# Patient Record
Sex: Male | Born: 1980 | Race: White | Hispanic: No | Marital: Married | Smoking: Current some day smoker
Health system: Southern US, Community
[De-identification: ages and names within clinical notes are randomized; demographics above are authoritative.]

## PROBLEM LIST (undated history)

## (undated) DIAGNOSIS — F419 Anxiety disorder, unspecified: Secondary | ICD-10-CM

## (undated) DIAGNOSIS — F32A Depression, unspecified: Secondary | ICD-10-CM

## (undated) DIAGNOSIS — F329 Major depressive disorder, single episode, unspecified: Secondary | ICD-10-CM

## (undated) HISTORY — DX: Anxiety disorder, unspecified: F41.9

## (undated) HISTORY — DX: Depression, unspecified: F32.A

## (undated) HISTORY — DX: Major depressive disorder, single episode, unspecified: F32.9

---

## 2008-01-07 ENCOUNTER — Emergency Department (HOSPITAL_COMMUNITY): Admission: EM | Admit: 2008-01-07 | Discharge: 2008-01-07 | Payer: Self-pay | Admitting: Emergency Medicine

## 2008-01-12 ENCOUNTER — Emergency Department (HOSPITAL_COMMUNITY): Admission: EM | Admit: 2008-01-12 | Discharge: 2008-01-12 | Payer: Self-pay | Admitting: Emergency Medicine

## 2009-09-07 ENCOUNTER — Emergency Department (HOSPITAL_COMMUNITY): Admission: EM | Admit: 2009-09-07 | Discharge: 2009-09-07 | Payer: Self-pay | Admitting: Emergency Medicine

## 2009-12-09 ENCOUNTER — Ambulatory Visit: Payer: Self-pay | Admitting: Unknown Physician Specialty

## 2011-07-04 LAB — DIFFERENTIAL
Basophils Relative: 2 — ABNORMAL HIGH
Eosinophils Absolute: 0.1
Eosinophils Relative: 2
Lymphs Abs: 1.3
Monocytes Absolute: 0.6
Monocytes Relative: 12

## 2011-07-04 LAB — RAPID URINE DRUG SCREEN, HOSP PERFORMED
Amphetamines: NOT DETECTED
Cocaine: NOT DETECTED
Opiates: NOT DETECTED
Tetrahydrocannabinol: NOT DETECTED

## 2011-07-04 LAB — URINALYSIS, ROUTINE W REFLEX MICROSCOPIC
Bilirubin Urine: NEGATIVE
Hgb urine dipstick: NEGATIVE
Ketones, ur: NEGATIVE
Nitrite: NEGATIVE
Specific Gravity, Urine: 1.008
Urobilinogen, UA: 0.2

## 2011-07-04 LAB — COMPREHENSIVE METABOLIC PANEL
ALT: 14
AST: 21
Alkaline Phosphatase: 55
Calcium: 8.7
GFR calc Af Amer: >60
Potassium: 3.4 — ABNORMAL LOW
Sodium: 137
Total Protein: 6.5

## 2011-07-04 LAB — CBC
MCHC: 35.2
RBC: 4.9
RDW: 12.5

## 2011-07-04 LAB — ETHANOL: Alcohol, Ethyl (B): 26 — ABNORMAL HIGH

## 2011-12-12 ENCOUNTER — Emergency Department: Payer: Self-pay | Admitting: Emergency Medicine

## 2011-12-12 LAB — URINALYSIS, COMPLETE
Bilirubin,UR: NEGATIVE
Glucose,UR: NEGATIVE mg/dL (ref 0–75)
Ketone: NEGATIVE
Nitrite: POSITIVE
Ph: 5 (ref 4.5–8.0)
Protein: 100
RBC,UR: 27 /HPF (ref 0–5)
Specific Gravity: 1.031 (ref 1.003–1.030)
Squamous Epithelial: NONE SEEN
Transitional Epi: 13
WBC UR: 940 /HPF (ref 0–5)

## 2011-12-12 LAB — CBC
HCT: 43.6 % (ref 40.0–52.0)
HGB: 15 g/dL (ref 13.0–18.0)
MCH: 32.1 pg (ref 26.0–34.0)
MCHC: 34.4 g/dL (ref 32.0–36.0)
MCV: 93 fL (ref 80–100)
Platelet: 207 10*3/uL (ref 150–440)
RBC: 4.67 10*6/uL (ref 4.40–5.90)
RDW: 12 % (ref 11.5–14.5)
WBC: 19 10*3/uL — ABNORMAL HIGH (ref 3.8–10.6)

## 2011-12-12 LAB — BASIC METABOLIC PANEL
Anion Gap: 13 (ref 7–16)
BUN: 16 mg/dL (ref 7–18)
Calcium, Total: 9.3 mg/dL (ref 8.5–10.1)
Chloride: 105 mmol/L (ref 98–107)
Co2: 25 mmol/L (ref 21–32)
Creatinine: 1.27 mg/dL (ref 0.60–1.30)
EGFR (African American): >60
EGFR (Non-African Amer.): >60
Glucose: 139 mg/dL — ABNORMAL HIGH (ref 65–99)
Osmolality: 288 (ref 275–301)
Potassium: 3.8 mmol/L (ref 3.5–5.1)
Sodium: 143 mmol/L (ref 136–145)

## 2011-12-14 ENCOUNTER — Inpatient Hospital Stay: Payer: Self-pay | Admitting: *Deleted

## 2011-12-14 LAB — BASIC METABOLIC PANEL
Anion Gap: 13 (ref 7–16)
BUN: 14 mg/dL (ref 7–18)
Calcium, Total: 9 mg/dL (ref 8.5–10.1)
Creatinine: 1.13 mg/dL (ref 0.60–1.30)
EGFR (Non-African Amer.): >60
Glucose: 100 mg/dL — ABNORMAL HIGH (ref 65–99)
Osmolality: 280 (ref 275–301)
Potassium: 3.8 mmol/L (ref 3.5–5.1)
Sodium: 140 mmol/L (ref 136–145)

## 2011-12-14 LAB — URINALYSIS, COMPLETE
Bacteria: NONE SEEN
Bilirubin,UR: NEGATIVE
Glucose,UR: NEGATIVE mg/dL (ref 0–75)
Nitrite: POSITIVE
RBC,UR: 44 /HPF (ref 0–5)
Specific Gravity: 1.021 (ref 1.003–1.030)
WBC UR: 16 /HPF (ref 0–5)

## 2011-12-14 LAB — CBC WITH DIFFERENTIAL/PLATELET
Basophil #: 0 10*3/uL (ref 0.0–0.1)
Basophil %: 0.1 %
Eosinophil #: 0 10*3/uL (ref 0.0–0.7)
Eosinophil %: 0 %
HCT: 39.3 % — ABNORMAL LOW (ref 40.0–52.0)
Lymphocyte %: 5.2 %
MCH: 31.8 pg (ref 26.0–34.0)
MCHC: 34 g/dL (ref 32.0–36.0)
Monocyte #: 1.5 10*3/uL — ABNORMAL HIGH (ref 0.0–0.7)
Neutrophil #: 13.1 10*3/uL — ABNORMAL HIGH (ref 1.4–6.5)
RBC: 4.21 10*6/uL — ABNORMAL LOW (ref 4.40–5.90)
RDW: 11.7 % (ref 11.5–14.5)
WBC: 15.4 10*3/uL — ABNORMAL HIGH (ref 3.8–10.6)

## 2011-12-15 LAB — CBC WITH DIFFERENTIAL/PLATELET
Basophil #: 0 10*3/uL (ref 0.0–0.1)
Eosinophil #: 0 10*3/uL (ref 0.0–0.7)
HCT: 36.4 % — ABNORMAL LOW (ref 40.0–52.0)
Lymphocyte %: 15.9 %
MCHC: 33.6 g/dL (ref 32.0–36.0)
Monocyte %: 11.5 %
Neutrophil #: 5.5 10*3/uL (ref 1.4–6.5)
Neutrophil %: 72.1 %
Platelet: 193 10*3/uL (ref 150–440)
RDW: 12.1 % (ref 11.5–14.5)
WBC: 7.6 10*3/uL (ref 3.8–10.6)

## 2011-12-15 LAB — BASIC METABOLIC PANEL
Anion Gap: 10 (ref 7–16)
BUN: 8 mg/dL (ref 7–18)
Chloride: 105 mmol/L (ref 98–107)
EGFR (Non-African Amer.): >60
Glucose: 93 mg/dL (ref 65–99)
Osmolality: 279 (ref 275–301)
Potassium: 3.9 mmol/L (ref 3.5–5.1)
Sodium: 141 mmol/L (ref 136–145)

## 2011-12-15 LAB — URINE CULTURE

## 2011-12-27 LAB — CULTURE, BLOOD (SINGLE)

## 2011-12-29 ENCOUNTER — Emergency Department (HOSPITAL_COMMUNITY)
Admission: EM | Admit: 2011-12-29 | Discharge: 2011-12-30 | Disposition: A | Discharge status: Home / Self Care | Payer: Worker's Compensation | Attending: Emergency Medicine | Admitting: Emergency Medicine

## 2011-12-29 ENCOUNTER — Encounter (HOSPITAL_COMMUNITY): Payer: Self-pay | Admitting: Emergency Medicine

## 2011-12-29 DIAGNOSIS — F17200 Nicotine dependence, unspecified, uncomplicated: Secondary | ICD-10-CM | POA: Insufficient documentation

## 2011-12-29 DIAGNOSIS — K644 Residual hemorrhoidal skin tags: Secondary | ICD-10-CM | POA: Insufficient documentation

## 2011-12-29 DIAGNOSIS — K649 Unspecified hemorrhoids: Secondary | ICD-10-CM

## 2011-12-29 NOTE — ED Notes (Signed)
PT. REPORTS RECTAL PAIN "MASS" ONSET LAST Tuesday , DENIES INJURY/ NO DISCHARGE.

## 2011-12-30 MED ORDER — HYDROCORTISONE ACE-PRAMOXINE 1-1 % RE FOAM: 1.0000 | Freq: Two times a day (BID) | RECTAL | Status: AC

## 2011-12-30 MED ORDER — HYDROCODONE-ACETAMINOPHEN 5-325 MG PO TABS: 1.0000 | ORAL_TABLET | Freq: Once | ORAL | Status: AC

## 2011-12-30 MED ORDER — HYDROCODONE-ACETAMINOPHEN 5-325 MG PO TABS
1.0000 | ORAL_TABLET | Freq: Once | ORAL | Status: AC
  Administered 2011-12-30: 1 via ORAL
  Filled 2011-12-30: qty 1

## 2011-12-30 MED ORDER — DOCUSATE SODIUM 100 MG PO CAPS: 100.0000 mg | ORAL_CAPSULE | Freq: Two times a day (BID) | ORAL | Status: AC

## 2011-12-30 NOTE — ED Provider Notes (Signed)
History     CSN: [id number]  Arrival date & time 12/29/11  2015   First MD Initiated Contact with Patient 12/30/11 0005      Chief Complaint  Patient presents with  . Rectal Pain    HPI The patient noticed rectal pain a couple of days ago. He first noticed after he had been lifting something at work. The pain has been getting worse. He has felt a swelling in the area as well. He denies any fevers, vomiting, or diarrhea. He denies any previous symptoms similar to this in the past. The pain is moderate to severe and increases with movement and palpation. He has not had any rectal bleeding. He has not had trouble with constipation  .History reviewed. No pertinent past medical history.  History reviewed. No pertinent past surgical history.  No family history on file.  History  Substance Use Topics  . Smoking status: Current Everyday Smoker  . Smokeless tobacco: Not on file  . Alcohol Use: Yes      Review of Systems  All other systems reviewed and are negative.    Allergies  Review of patient's allergies indicates no known allergies.  Home Medications  No current outpatient prescriptions on file.  BP 131/79  Pulse 99  Temp(Src) 98.8 F (37.1 C) (Oral)  Resp 16  SpO2 97%  Physical Exam  Nursing note and vitals reviewed. Constitutional: He appears well-developed and well-nourished. No distress.  HENT:  Head: Normocephalic and atraumatic.  Right Ear: External ear normal.  Left Ear: External ear normal.  Eyes: Conjunctivae are normal. Right eye exhibits no discharge. Left eye exhibits no discharge. No scleral icterus.  Neck: Neck supple. No tracheal deviation present.  Cardiovascular: Normal rate.   Pulmonary/Chest: Effort normal. No stridor. No respiratory distress.  Genitourinary: Rectal exam shows external hemorrhoid and tenderness.       Approximately 1 cm sized external hemorrhoid, slight bluish discoloration, hemorrhoid is soft  Musculoskeletal: He exhibits  no edema.  Neurological: He is alert. Cranial nerve deficit: no gross deficits.  Skin: Skin is warm and dry. No rash noted.  Psychiatric: He has a normal mood and affect.    ED Course  Procedures (including critical care time)  Labs Reviewed - No data to display No results found.    MDM  Patient's symptoms are consistent with an external hemorrhoid. There may be a component of thrombosis but it is not completely thrombosed. Patient will prescribe medications for pain and topical steroids. We'll give him a referral to a general surgeon for further treatment if the symptoms do not improve        Celene Kras, MD 12/30/11 [phone]

## 2011-12-30 NOTE — ED Notes (Signed)
The pt says he has a mass on or near his rectum he noticed  This past Tuesday.  Extreme pain tonight while he was at work.  Alert skin arm and dry

## 2011-12-30 NOTE — Discharge Instructions (Signed)
Hemorrhoids Hemorrhoids are enlarged (dilated) veins around the rectum. There are 2 types of hemorrhoids, and the type of hemorrhoid is determined by its location. Internal hemorrhoids occur in the veins just inside the rectum.They are usually not painful, but they may bleed.However, they may poke through to the outside and become irritated and painful. External hemorrhoids involve the veins outside the anus and can be felt as a painful swelling or hard lump near the anus.They are often itchy and may crack and bleed. Sometimes clots will form in the veins. This makes them swollen and painful. These are called thrombosed hemorrhoids. CAUSES Causes of hemorrhoids include:  Pregnancy. This increases the pressure in the hemorrhoidal veins.   Constipation.   Straining to have a bowel movement.   Obesity.   Heavy lifting or other activity that caused you to strain.  TREATMENT Most of the time hemorrhoids improve in 1 to 2 weeks. However, if symptoms do not seem to be getting better or if you have a lot of rectal bleeding, your caregiver may perform a procedure to help make the hemorrhoids get smaller or remove them completely.Possible treatments include:  Rubber band ligation. A rubber band is placed at the base of the hemorrhoid to cut off the circulation.   Sclerotherapy. A chemical is injected to shrink the hemorrhoid.   Infrared light therapy. Tools are used to burn the hemorrhoid.   Hemorrhoidectomy. This is surgical removal of the hemorrhoid.  HOME CARE INSTRUCTIONS   Increase fiber in your diet. Ask your caregiver about using fiber supplements.   Drink enough water and fluids to keep your urine clear or pale yellow.   Exercise regularly.   Go to the bathroom when you have the urge to have a bowel movement. Do not wait.   Avoid straining to have bowel movements.   Keep the anal area dry and clean.   Only take over-the-counter or prescription medicines for pain, discomfort,  or fever as directed by your caregiver.  If your hemorrhoids are thrombosed:  Take warm sitz baths for 20 to 30 minutes, 3 to 4 times per day.   If the hemorrhoids are very tender and swollen, place ice packs on the area as tolerated. Using ice packs between sitz baths may be helpful. Fill a plastic bag with ice. Place a towel between the bag of ice and your skin.   Medicated creams and suppositories may be used or applied as directed.   Do not use a donut-shaped pillow or sit on the toilet for long periods. This increases blood pooling and pain.  SEEK MEDICAL CARE IF:   You have increasing pain and swelling that is not controlled with your medicine.   You have uncontrolled bleeding.   You have difficulty or you are unable to have a bowel movement.   You have pain or inflammation outside the area of the hemorrhoids.   You have chills or an oral temperature above 102 F (38.9 C).  MAKE SURE YOU:   Understand these instructions.   Will watch your condition.   Will get help right away if you are not doing well or get worse.  Document Released: 09/23/2000 Document Revised: 09/15/2011 Document Reviewed: 01/29/2008 [facility] Patient Information 2012 [place], [place].Hemorrhoid Banding Hemorrhoids are veins in the anus and lower rectum that become enlarged. The most common symptoms are rectal bleeding, itching, and sometimes pain. Hemorrhoids might come out with straining or having a bowel movement, and they can sometimes be pushed back in. There are  internal and external hemorrhoids. Only internal hemorrhoids can be treated with banding. In this procedure, a rubber band is placed near the hemorrhoid tissue, cutting off the blood supply. This procedure prevents the hemorrhoids from slipping down. LET YOUR CAREGIVER KNOW ABOUT: All medicines you are taking, especially blood thinners such as aspirin and coumadin.  RISKS AND COMPLICATIONS This is not a painful procedure, but if you do have  intense pain immediately let your surgeon know because the band may need to be removed. You may have some mild pain or discomfort in the first 2 days or so after treatment. Sometimes there may be delayed bleeding in the first week after treatment.  BEFORE THE PROCEDURE  There is no special preparation needed before banding. Your surgeon may have you do an enema prior to the procedure. You will go home the same day.  HOME CARE INSTRUCTIONS   Your surgeon might instruct you to do sitz baths as needed if you have discomfort or after a bowel movement.   You may be instructed to use fiber supplements.  SEEK MEDICAL CARE IF:  You have an increase in pain.   Your pain does not get better.  SEEK IMMEDIATE MEDICAL CARE IF:  You have intense pain.   Fever greater than 100.5 F (38.1 C).   Bleeding that does not stop, or pus from the anus.  Document Released: 07/24/2009 Document Revised: 09/15/2011 Document Reviewed: 07/24/2009 [facility] Patient Information 2012 [place], [place].

## 2012-07-01 ENCOUNTER — Encounter (HOSPITAL_COMMUNITY): Payer: Self-pay | Admitting: *Deleted

## 2012-07-01 ENCOUNTER — Emergency Department (HOSPITAL_COMMUNITY)
Admission: EM | Admit: 2012-07-01 | Discharge: 2012-07-02 | Disposition: A | Discharge status: Home / Self Care | Payer: Worker's Compensation | Attending: Emergency Medicine | Admitting: Emergency Medicine

## 2012-07-01 ENCOUNTER — Emergency Department (HOSPITAL_COMMUNITY): Payer: Worker's Compensation

## 2012-07-01 DIAGNOSIS — W208XXA Other cause of strike by thrown, projected or falling object, initial encounter: Secondary | ICD-10-CM | POA: Insufficient documentation

## 2012-07-01 DIAGNOSIS — F17200 Nicotine dependence, unspecified, uncomplicated: Secondary | ICD-10-CM | POA: Insufficient documentation

## 2012-07-01 DIAGNOSIS — Y990 Civilian activity done for income or pay: Secondary | ICD-10-CM | POA: Insufficient documentation

## 2012-07-01 DIAGNOSIS — S9030XA Contusion of unspecified foot, initial encounter: Secondary | ICD-10-CM | POA: Insufficient documentation

## 2012-07-01 MED ORDER — OXYCODONE-ACETAMINOPHEN 5-325 MG PO TABS: 1.0000 | ORAL_TABLET | ORAL | Status: DC | PRN

## 2012-07-01 MED ORDER — OXYCODONE-ACETAMINOPHEN 5-325 MG PO TABS
2.0000 | ORAL_TABLET | Freq: Once | ORAL | Status: AC
  Administered 2012-07-01: 2 via ORAL
  Filled 2012-07-01: qty 2

## 2012-07-01 NOTE — Discharge Instructions (Signed)
Contusion A contusion is a deep bruise. Contusions happen when an injury causes bleeding under the skin. Signs of bruising include pain, puffiness (swelling), and discolored skin. The contusion may turn blue, purple, or yellow. HOME CARE   Put ice on the injured area.   Put ice in a plastic bag.   Place a towel between your skin and the bag.   Leave the ice on for 15 to 20 minutes, 3 to 4 times a day.   Only take medicine as told by your doctor.   Rest the injured area.   If possible, raise (elevate) the injured area to lessen puffiness.  GET HELP RIGHT AWAY IF:   You have more bruising or puffiness.   You have pain that is getting worse.   Your puffiness or pain is not helped by medicine.  MAKE SURE YOU:   Understand these instructions.   Will watch your condition.   Will get help right away if you are not doing well or get worse.  Document Released: 03/14/2008 Document Revised: 09/15/2011 Document Reviewed: 08/01/2011 [facility] Patient Information 2012 [place], [place].Cryotherapy Cryotherapy means treatment with cold. Ice or gel packs can be used to reduce both pain and swelling. Ice is the most helpful within the first 24 to 48 hours after an injury or flareup from overusing a muscle or joint. Sprains, strains, spasms, burning pain, shooting pain, and aches can all be eased with ice. Ice can also be used when recovering from surgery. Ice is effective, has very few side effects, and is safe for most people to use. PRECAUTIONS  Ice is not a safe treatment option for people with:  Raynaud's phenomenon. This is a condition affecting small blood vessels in the extremities. Exposure to cold may cause your problems to return.   Cold hypersensitivity. There are many forms of cold hypersensitivity, including:   Cold urticaria. Red, itchy hives appear on the skin when the tissues begin to warm after being iced.   Cold erythema. This is a red, itchy rash caused by exposure to cold.     Cold hemoglobinuria. Red blood cells break down when the tissues begin to warm after being iced. The hemoglobin that carry oxygen are passed into the urine because they cannot combine with blood proteins fast enough.   Numbness or altered sensitivity in the area being iced.  If you have any of the following conditions, do not use ice until you have discussed cryotherapy with your caregiver:  Heart conditions, such as arrhythmia, angina, or chronic heart disease.   High blood pressure.   Healing wounds or open skin in the area being iced.   Current infections.   Rheumatoid arthritis.   Poor circulation.   Diabetes.  Ice slows the blood flow in the region it is applied. This is beneficial when trying to stop inflamed tissues from spreading irritating chemicals to surrounding tissues. However, if you expose your skin to cold temperatures for too long or without the proper protection, you can damage your skin or nerves. Watch for signs of skin damage due to cold. HOME CARE INSTRUCTIONS Follow these tips to use ice and cold packs safely.  Place a dry or damp towel between the ice and skin. A damp towel will cool the skin more quickly, so you may need to shorten the time that the ice is used.   For a more rapid response, add gentle compression to the ice.   Ice for no more than 10 to 20 minutes at  a time. The bonier the area you are icing, the less time it will take to get the benefits of ice.   Check your skin after 5 minutes to make sure there are no signs of a poor response to cold or skin damage.   Rest 20 minutes or more in between uses.   Once your skin is numb, you can end your treatment. You can test numbness by very lightly touching your skin. The touch should be so light that you do not see the skin dimple from the pressure of your fingertip. When using ice, most people will feel these normal sensations in this order: cold, burning, aching, and numbness.   Do not use ice on  someone who cannot communicate their responses to pain, such as small children or people with dementia.  HOW TO MAKE AN ICE PACK Ice packs are the most common way to use ice therapy. Other methods include ice massage, ice baths, and cryo-sprays. Muscle creams that cause a cold, tingly feeling do not offer the same benefits that ice offers and should not be used as a substitute unless recommended by your caregiver. To make an ice pack, do one of the following:  Place crushed ice or a bag of frozen vegetables in a sealable plastic bag. Squeeze out the excess air. Place this bag inside another plastic bag. Slide the bag into a pillowcase or place a damp towel between your skin and the bag.   Mix 3 parts water with 1 part rubbing alcohol. Freeze the mixture in a sealable plastic bag. When you remove the mixture from the freezer, it will be slushy. Squeeze out the excess air. Place this bag inside another plastic bag. Slide the bag into a pillowcase or place a damp towel between your skin and the bag.  SEEK MEDICAL CARE IF:  You develop white spots on your skin. This may give the skin a blotchy (mottled) appearance.   Your skin turns blue or pale.   Your skin becomes waxy or hard.   Your swelling gets worse.  MAKE SURE YOU:   Understand these instructions.   Will watch your condition.   Will get help right away if you are not doing well or get worse.  Document Released: 05/23/2011 Document Revised: 09/15/2011 Document Reviewed: 05/23/2011 [facility] Patient Information 2012 [place], [place].Contusion (Bruise) of Foot Injury to the foot causes bruises (contusions). Contusions are caused by bleeding from small blood vessels that allow blood to leak out into the muscles, cord-like structures that attach muscle to bone (tendons), and/or other soft tissue.  CAUSES  Contusions of the foot are common. Bruises are frequently seen from:  Contact sports injuries.   The use of medications that thin the  blood (anti-coagulants).   Aspirin and non-steroidal anti-inflammatory agents that decrease the clotting ability.   People with vitamin deficiencies.  SYMPTOMS  Signs of foot injury include pain and swelling. At first there may be discoloration from blood under the skin. This will appear blue to purple in color. As the bruise ages, the color turns yellow. Swelling may limit the movement of the toes.  Complications from foot injury may include:  Collections of blood leading to disability if calcium deposits form. These can later limit movement in the foot.   Infection of the foot if there are breaks in the skin.   Rupture of the tendons that may need surgical repair.  DIAGNOSIS  Diagnosing foot injuries can be made by observation. If problems continue, X-rays  may be needed to make sure there are no broken bones (fractures). Continuing problems may require physical therapy.  HOME CARE INSTRUCTIONS   Apply ice to the injury for 15 to 20 minutes, 3 to 4 times per day. Put the ice in a plastic bag and place a towel between the bag of ice and your skin.   An elastic wrap (like an Ace bandage) may be used to keep swelling down.   Keep foot elevated to reduce swelling and discomfort.   Try to avoid standing or walking while the foot is painful. Do not resume use until instructed by your caregiver. Then begin use gradually. If pain develops, decrease use and continue the above measures. Gradually increase activities that do not cause discomfort until you slowly have normal use.   Only take over-the-counter or prescription medicines for pain, discomfort, or fever as directed by your caregiver. Use only if your caregiver has not given medications that would interfere.   Begin daily rehabilitation exercises when supportive wrapping is no longer needed.   Use ice massage for 10 minutes before and after workouts. Fill a large styrofoam cup with water and freeze. Tear a small amount of foam from the  top so ice protrudes. Massage ice firmly over the injured area in a circle about the size of a softball.   Always eat a well balanced diet.   Follow all instructions for follow up with your caregiver, any orthopedic referrals, physical therapy and rehabilitation. Any delay in obtaining necessary care could result in delayed healing, and temporary or permanent disability.  SEEK IMMEDIATE MEDICAL CARE IF:   Your pain and swelling increase, or pain is uncontrolled with medications.   You have loss of feeling in your foot, or your foot turns cold or blue.   An oral temperature above 102 F (38.9 C) develops, not controlled by medication.   Your foot becomes warm to touch, or you have more pain with movement of your toes.   You have a foot contusion that does not improve in 1 or 2 days.   Skin is broken and signs of infection occur (drainage, increasing pain, fever, headache, muscle aches, dizziness or a general ill feeling).   You develop new, unexplained symptoms, or an increase of the symptoms that brought you to your caregiver.  MAKE SURE YOU:   Understand these instructions.   Will watch your condition.   Will get help right away if you are not doing well or get worse.  Document Released: 07/18/2006 Document Revised: 09/15/2011 Document Reviewed: 08/30/2011 [facility] Patient Information 2012 [place], [place]. Please keep your foot elevated as much as possible.  Higher than lower part if possible, nonweightbearing on that foot until you are reevaluated by occupational health or Dr. Rennis Chris

## 2012-07-01 NOTE — ED Notes (Signed)
Ice pack applied.

## 2012-07-01 NOTE — ED Notes (Signed)
The pt was at work when a pallet jack fell onto his foot.  The equipment weighs 200 albs.  Redness across the top of his foot

## 2012-07-01 NOTE — ED Provider Notes (Signed)
History     CSN: [id number]  Arrival date & time 07/01/12  2112   None     Chief Complaint  Patient presents with  . Foot Injury    (Consider location/radiation/quality/duration/timing/severity/associated sxs/prior treatment) HPI Comments: Patient had piece of equipment fall on his right foot at work.  He hit his foot across the instep.  There is slight erythema to the area, without any break in the skin.  He did not take any medication.  Prior to arrival  Patient is a 31 y.o. male presenting with foot injury. The history is provided by the patient.  Foot Injury  The incident occurred 1 to 2 hours ago. The incident occurred at work. The injury mechanism was a direct blow. The pain is present in the right foot. The pain is at a severity of 9/10. The pain is moderate. The pain has been constant since onset. Associated symptoms include inability to bear weight. Pertinent negatives include no numbness, no loss of motion, no muscle weakness, no loss of sensation and no tingling. He reports no foreign bodies present. The symptoms are aggravated by activity. He has tried nothing for the symptoms.    History reviewed. No pertinent past medical history.  History reviewed. No pertinent past surgical history.  No family history on file.  History  Substance Use Topics  . Smoking status: Current Every Day Smoker  . Smokeless tobacco: Not on file  . Alcohol Use: Yes      Review of Systems  Constitutional: Negative for fever and chills.  Cardiovascular: Negative for leg swelling.  Gastrointestinal: Negative for nausea.  Musculoskeletal: Negative for joint swelling.  Skin: Negative for wound.  Neurological: Negative for dizziness, tingling, weakness and numbness.    Allergies  Review of patient's allergies indicates no known allergies.  Home Medications   Current Outpatient Rx  Name Route Sig Dispense Refill  . OXYCODONE-ACETAMINOPHEN 5-325 MG PO TABS Oral Take 1 tablet by mouth  every 4 (four) hours as needed for pain. 30 tablet 0    BP 117/78  Pulse 88  Temp 98.3 F (36.8 C) (Oral)  Resp 20  SpO2 98%  Physical Exam  Constitutional: He is oriented to person, place, and time. He appears well-developed and well-nourished.  HENT:  Head: Normocephalic.  Eyes: Pupils are equal, round, and reactive to light.  Neck: Normal range of motion.  Cardiovascular: Normal rate.   Pulmonary/Chest: Effort normal.  Abdominal: Soft.  Musculoskeletal: He exhibits tenderness. He exhibits no edema.       Feet:  Neurological: He is alert and oriented to person, place, and time.  Skin: Skin is warm. No erythema.    ED Course  Procedures (including critical care time)  Labs Reviewed - No data to display Dg Foot Complete Right  07/01/2012  *RADIOLOGY REPORT*  Clinical Data: Right foot pain and swelling.  RIGHT FOOT COMPLETE - 3+ VIEW  Comparison: None.  Findings: Anatomic alignment of the bones of the right foot.  No displaced fracture is identified.  Soft tissues appear within normal limits.  IMPRESSION: No acute osseous abnormality.   Original Report Authenticated By: Andreas Newport, M.D.      1. Contusion, foot       MDM   X-ray has been reviewed.  There is no fracture, we'll place the patient in a Lucky dressing with a posterior splint, and crutches allowed him to follow up with occupational health in the morning.  I also will give him a prescription for  pain control        Arman Filter, NP 07/02/12 0025

## 2012-07-02 NOTE — ED Provider Notes (Signed)
Medical screening examination/treatment/procedure(s) were performed by non-physician practitioner and as supervising physician I was immediately available for consultation/collaboration.  Olivia Mackie, MD 07/02/12 [phone]

## 2013-01-29 ENCOUNTER — Ambulatory Visit (INDEPENDENT_AMBULATORY_CARE_PROVIDER_SITE_OTHER): Payer: BC Managed Care – PPO | Admitting: Family Medicine

## 2013-01-29 ENCOUNTER — Encounter: Payer: Self-pay | Admitting: Family Medicine

## 2013-01-29 VITALS — BP 100/78 | HR 82 | Temp 98.5°F | Ht 72.0 in | Wt 149.2 lb

## 2013-01-29 DIAGNOSIS — F418 Other specified anxiety disorders: Secondary | ICD-10-CM | POA: Insufficient documentation

## 2013-01-29 DIAGNOSIS — F341 Dysthymic disorder: Secondary | ICD-10-CM

## 2013-01-29 MED ORDER — CITALOPRAM HYDROBROMIDE 10 MG PO TABS: 10.0000 mg | ORAL_TABLET | Freq: Every day | ORAL | Status: DC

## 2013-01-29 MED ORDER — ALPRAZOLAM 0.25 MG PO TABS: 0.2500 mg | ORAL_TABLET | Freq: Two times a day (BID) | ORAL | Status: DC | PRN

## 2013-01-29 NOTE — Assessment & Plan Note (Addendum)
Predominant anxiety with occasional panic attacks. Start celexa and alprazolam temporarily. Discussed importance of healthy coping strategies for stress. Discussed common side effects of SSRIs. Encouraged healthier eating and decreased caffeine intake.  PHQ9 = 14/27, extremely difficult to function  GAD7 = 18/21

## 2013-01-29 NOTE — Patient Instructions (Signed)
I do think you have predominant anxiety with what sound like occasional panic attacks. Start celexa 10mg  daily to help control these symptoms. May use xanax (alprazolam) as needed for anxiety attacks Work on healthy coping strategies. Return to see me in 6 weeks for follow up. Good to meet you today, call us with questions.

## 2013-01-29 NOTE — Progress Notes (Signed)
Subjective:    Patient ID: Adrian Walton, male    DOB: 1981/01/03, 32 y.o.   MRN: [id number]  HPI CC: new pt to establish  Husband of Hitesh Fouche.  Significant anxiety issues.  Some depression.  Tends to happen more at work - notes anxiety attacks described as "brain and body 90 miles/hour", short of breath, restlessness, more irritable.  Worries about this.  This is also affecting home life.  interested in daily med. Prior on zoloft daily which wife did not like how it affected him - blase attitude.   He has taken xanax in past which has helped significantly.  Has had anxiety issues on and off for about 8 years. Occasional work related self-doubt.   Some isolation. Some work stress. No anhedonia, sleep ok, appetite stable, no SI/HI.  No energy.  Significant amt energy drinks. Caffeine: 2 energy drinks, 2 cups coffee, 2-3 sodas  Preventative: No recent CPE. Tetanus 2008  Lives with wife, and son, 4 dogs, 2 cats, ferret Occupation: Barrister's clerk Edu: [employer] Activity: stays active at work Diet: good water, fruits/vegetables occasionally  Medications and allergies reviewed and updated in chart.  Past histories reviewed and updated if relevant as below. There is no problem list on file for this patient.  Past Medical History  Diagnosis Date  . Anxiety   . Depression     failed zoloft   History reviewed. No pertinent past surgical history. History  Substance Use Topics  . Smoking status: Current Some Day Smoker -- 1.00 packs/day for 10 years    Types: Cigarettes  . Smokeless tobacco: Never Used     Comment: Currently using e-cig  . Alcohol Use: Yes     Comment: occasional   Family History  Problem Relation Age of Onset  . Alcohol abuse Father   . Cancer Mother     skin  . Diabetes Neg Hx   . CAD Neg Hx   . Stroke Neg Hx    No Known Allergies No current outpatient prescriptions on file prior to visit.   No current facility-administered medications on file  prior to visit.     Review of Systems  Constitutional: Negative for fever, chills, activity change, appetite change, fatigue and unexpected weight change.  HENT: Negative for hearing loss and neck pain.   Eyes: Negative for visual disturbance.  Respiratory: Negative for cough, chest tightness, shortness of breath and wheezing.   Cardiovascular: Negative for chest pain, palpitations and leg swelling.  Gastrointestinal: Negative for nausea, vomiting, abdominal pain, diarrhea, constipation, blood in stool and abdominal distention.  Genitourinary: Negative for hematuria and difficulty urinating.  Musculoskeletal: Negative for myalgias and arthralgias.  Skin: Negative for rash.  Neurological: Negative for dizziness, seizures, syncope and headaches.  Hematological: Negative for adenopathy. Does not bruise/bleed easily.  Psychiatric/Behavioral: Positive for dysphoric mood. The patient is nervous/anxious.        Objective:   Physical Exam  Nursing note and vitals reviewed. Constitutional: He is oriented to person, place, and time. He appears well-developed and well-nourished. No distress.  HENT:  Head: Normocephalic and atraumatic.  Right Ear: External ear normal.  Left Ear: External ear normal.  Nose: Nose normal.  Mouth/Throat: Oropharynx is clear and moist. No oropharyngeal exudate.  Eyes: Conjunctivae and EOM are normal. Pupils are equal, round, and reactive to light. No scleral icterus.  Neck: Normal range of motion. Neck supple. No thyromegaly present.  Cardiovascular: Normal rate, regular rhythm, normal heart sounds and intact distal pulses.  No murmur heard. Pulses:      Radial pulses are 2+ on the right side, and 2+ on the left side.  Pulmonary/Chest: Effort normal and breath sounds normal. No respiratory distress. He has no wheezes. He has no rales.  Abdominal: Soft. Bowel sounds are normal. He exhibits no distension and no mass. There is no tenderness. There is no rebound and  no guarding.  Musculoskeletal: Normal range of motion. He exhibits no edema.  Lymphadenopathy:    He has no cervical adenopathy.  Neurological: He is alert and oriented to person, place, and time.  CN grossly intact, station and gait intact  Skin: Skin is warm and dry. No rash noted.  Psychiatric: He has a normal mood and affect. His behavior is normal. Judgment and thought content normal.       Assessment & Plan:

## 2013-03-14 ENCOUNTER — Encounter: Payer: Self-pay | Admitting: Radiology

## 2013-03-15 ENCOUNTER — Ambulatory Visit: Payer: BC Managed Care – PPO | Admitting: Family Medicine

## 2013-03-28 ENCOUNTER — Encounter: Payer: Self-pay | Admitting: *Deleted

## 2013-03-29 ENCOUNTER — Encounter: Payer: Self-pay | Admitting: Family Medicine

## 2013-03-29 ENCOUNTER — Ambulatory Visit (INDEPENDENT_AMBULATORY_CARE_PROVIDER_SITE_OTHER): Payer: BC Managed Care – PPO | Admitting: Family Medicine

## 2013-03-29 VITALS — BP 130/80 | HR 84 | Temp 98.2°F | Wt 153.2 lb

## 2013-03-29 DIAGNOSIS — M797 Fibromyalgia: Secondary | ICD-10-CM | POA: Insufficient documentation

## 2013-03-29 DIAGNOSIS — F418 Other specified anxiety disorders: Secondary | ICD-10-CM

## 2013-03-29 DIAGNOSIS — F341 Dysthymic disorder: Secondary | ICD-10-CM

## 2013-03-29 DIAGNOSIS — R52 Pain, unspecified: Secondary | ICD-10-CM

## 2013-03-29 MED ORDER — HYDROXYZINE HCL 25 MG PO TABS: 12.5000 mg | ORAL_TABLET | Freq: Three times a day (TID) | ORAL | Status: DC | PRN

## 2013-03-29 MED ORDER — NAPROXEN 500 MG PO TABS: 500.0000 mg | ORAL_TABLET | Freq: Two times a day (BID) | ORAL | Status: DC

## 2013-03-29 MED ORDER — CITALOPRAM HYDROBROMIDE 20 MG PO TABS: 20.0000 mg | ORAL_TABLET | Freq: Every day | ORAL | Status: DC

## 2013-03-29 NOTE — Progress Notes (Signed)
  Subjective:    Patient ID: Adrian Walton, male    DOB: Mar 05, 1981, 32 y.o.   MRN: [id number]  HPI CC: f/u anxiety attacks  Seen here 01/29/2013 with dx anxiety with panic attacks.  Started on celexa 10mg  daily - takes at noon - and alprazolam 0.25mg .  continued panic attacks, but improved.  Stress relieving strategies - spends time with son.  Recently went to beach for vacation.  Work stress - improving some.  Asks about medicine for body aches - present for months.  Also with significant fatigue.  Past Medical History  Diagnosis Date  . Anxiety   . Depression     failed zoloft    Review of Systems Per HPI    Objective:   Physical Exam  Nursing note and vitals reviewed. Constitutional: He appears well-developed and well-nourished. No distress.  Cardiovascular: Normal rate, regular rhythm, normal heart sounds and intact distal pulses.   No murmur heard. Pulmonary/Chest: Effort normal and breath sounds normal. No respiratory distress. He has no wheezes. He has no rales.  Psychiatric: He has a normal mood and affect.       Assessment & Plan:

## 2013-03-29 NOTE — Assessment & Plan Note (Signed)
Improved but not optimal Recommended increase celexa to 20mg  daily. Will change xanax to vistaril 2/2 sedation at work - has taken vistaril well in past.

## 2013-03-29 NOTE — Assessment & Plan Note (Signed)
With fatigue. Discussed importance of sleep and diet to affect fatigue. Treat with naprosyn - if persistent, consider return for blood work to eval reversible causes.

## 2013-03-29 NOTE — Patient Instructions (Signed)
Increase celexa to 20mg  daily. May try naprosyn 500mg  at a time as needed for body aches. Update [place] if not improving with this.

## 2013-07-15 ENCOUNTER — Ambulatory Visit (INDEPENDENT_AMBULATORY_CARE_PROVIDER_SITE_OTHER): Payer: BC Managed Care – PPO | Admitting: Family Medicine

## 2013-07-15 ENCOUNTER — Encounter: Payer: Self-pay | Admitting: Family Medicine

## 2013-07-15 VITALS — BP 124/70 | HR 78 | Temp 98.0°F | Wt 157.5 lb

## 2013-07-15 DIAGNOSIS — R52 Pain, unspecified: Secondary | ICD-10-CM

## 2013-07-15 DIAGNOSIS — M791 Myalgia, unspecified site: Secondary | ICD-10-CM

## 2013-07-15 DIAGNOSIS — H532 Diplopia: Secondary | ICD-10-CM

## 2013-07-15 DIAGNOSIS — IMO0001 Reserved for inherently not codable concepts without codable children: Secondary | ICD-10-CM

## 2013-07-15 LAB — POCT URINALYSIS DIPSTICK
Bilirubin, UA: NEGATIVE
Blood, UA: NEGATIVE
Glucose, UA: NEGATIVE
Leukocytes, UA: NEGATIVE
Nitrite, UA: NEGATIVE

## 2013-07-15 NOTE — Patient Instructions (Addendum)
Go to the lab on the way out.  We'll contact you with your lab report. Shirlee Limerick will call about your referral for the head CT. I'll talk to Dr. Reece Agar in the meantime.  Don't change your meds for now.

## 2013-07-15 NOTE — Progress Notes (Signed)
Sx started about 6 months ago or longer, diffuse aches.  Had seen PCP prev.  Tired and aching.  Going on consistently in the interval.  "I can sleep anytime, anywhere."  He doesn't feel better on waking; amount of sleep doesn't change the way he feels.  Diffuse aches, muscles, joints.    Last night he was dizzy.  This isn't new but last night was worse than typical.  The room was spinning some last night.  He had seen double last night.  No vision loss.  No tunnel vision. 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  He occ has nausea.  No focal neuro weakness.  No rash.  No FH of similar. No known tick bites.  Naprosyn didn't help with aches.    No dysuria now.  H/o kidney infection last year, early 2013.   He is more irritable than prev.  No SI/HI.  Compliant with current meds.  He's frustrated with being irritable.  He has more trouble with concentration deficits.   Caffeine doesn't help with energy level.   Meds, vitals, and allergies reviewed.   ROS: See HPI.  Otherwise, noncontributory.  GEN: nad, alert and oriented HEENT: mucous membranes moist, TM wnl, nasal and OP exam wnl NECK: supple w/o LA CV: rrr.  no murmur PULM: ctab, no inc wob ABD: soft, +bs EXT: no edema SKIN: no acute rash CN 2-12 wnl B, S/S/DTR wnl x4, DHP mildly positive Fundus wnl B, limited exam No acute joint erythema in the ext x4

## 2013-07-16 ENCOUNTER — Telehealth: Payer: Self-pay | Admitting: Family Medicine

## 2013-07-16 ENCOUNTER — Emergency Department (HOSPITAL_COMMUNITY): Payer: BC Managed Care – PPO

## 2013-07-16 ENCOUNTER — Emergency Department (HOSPITAL_COMMUNITY)
Admission: EM | Admit: 2013-07-16 | Discharge: 2013-07-16 | Disposition: A | Discharge status: Home / Self Care | Payer: BC Managed Care – PPO | Attending: Emergency Medicine | Admitting: Emergency Medicine

## 2013-07-16 ENCOUNTER — Encounter (HOSPITAL_COMMUNITY): Payer: Self-pay | Admitting: Emergency Medicine

## 2013-07-16 ENCOUNTER — Ambulatory Visit (INDEPENDENT_AMBULATORY_CARE_PROVIDER_SITE_OTHER)
Admission: RE | Admit: 2013-07-16 | Discharge: 2013-07-16 | Disposition: A | Discharge status: Home / Self Care | Payer: BC Managed Care – PPO | Source: Ambulatory Visit | Attending: Family Medicine | Admitting: Family Medicine

## 2013-07-16 ENCOUNTER — Encounter: Payer: Self-pay | Admitting: Cardiology

## 2013-07-16 DIAGNOSIS — R51 Headache: Secondary | ICD-10-CM | POA: Insufficient documentation

## 2013-07-16 DIAGNOSIS — F411 Generalized anxiety disorder: Secondary | ICD-10-CM | POA: Insufficient documentation

## 2013-07-16 DIAGNOSIS — R42 Dizziness and giddiness: Secondary | ICD-10-CM | POA: Insufficient documentation

## 2013-07-16 DIAGNOSIS — F3289 Other specified depressive episodes: Secondary | ICD-10-CM | POA: Insufficient documentation

## 2013-07-16 DIAGNOSIS — F329 Major depressive disorder, single episode, unspecified: Secondary | ICD-10-CM | POA: Insufficient documentation

## 2013-07-16 DIAGNOSIS — H538 Other visual disturbances: Secondary | ICD-10-CM | POA: Insufficient documentation

## 2013-07-16 DIAGNOSIS — Z79899 Other long term (current) drug therapy: Secondary | ICD-10-CM | POA: Insufficient documentation

## 2013-07-16 DIAGNOSIS — F17200 Nicotine dependence, unspecified, uncomplicated: Secondary | ICD-10-CM | POA: Insufficient documentation

## 2013-07-16 DIAGNOSIS — R52 Pain, unspecified: Secondary | ICD-10-CM | POA: Insufficient documentation

## 2013-07-16 DIAGNOSIS — H532 Diplopia: Secondary | ICD-10-CM

## 2013-07-16 LAB — CBC WITH DIFFERENTIAL/PLATELET
Basophils Relative: 0.6 % (ref 0.0–3.0)
Eosinophils Relative: 0.9 % (ref 0.0–5.0)
Hemoglobin: 16 g/dL (ref 13.0–17.0)
MCV: 94.4 fl (ref 78.0–100.0)
Monocytes Absolute: 0.4 10*3/uL (ref 0.1–1.0)
Neutro Abs: 5 10*3/uL (ref 1.4–7.7)
Neutrophils Relative %: 71.2 % (ref 43.0–77.0)
RBC: 4.92 Mil/uL (ref 4.22–5.81)
WBC: 7 10*3/uL (ref 4.5–10.5)

## 2013-07-16 LAB — SEDIMENTATION RATE: Sed Rate: 5 mm/hr (ref 0–22)

## 2013-07-16 LAB — B. BURGDORFI ANTIBODIES: B burgdorferi Ab IgG+IgM: 0.46 {ISR}

## 2013-07-16 LAB — COMPREHENSIVE METABOLIC PANEL
Albumin: 4.8 g/dL (ref 3.5–5.2)
Alkaline Phosphatase: 49 U/L (ref 39–117)
BUN: 16 mg/dL (ref 6–23)
Creatinine, Ser: 1 mg/dL (ref 0.4–1.5)
Glucose, Bld: 83 mg/dL (ref 70–99)
Total Bilirubin: 1.8 mg/dL — ABNORMAL HIGH (ref 0.3–1.2)

## 2013-07-16 LAB — RAPID URINE DRUG SCREEN, HOSP PERFORMED: Opiates: NOT DETECTED

## 2013-07-16 MED ORDER — IOHEXOL 350 MG/ML SOLN
50.0000 mL | Freq: Once | INTRAVENOUS | Status: AC | PRN
  Administered 2013-07-16: 50 mL via INTRAVENOUS

## 2013-07-16 MED ORDER — KETOROLAC TROMETHAMINE 30 MG/ML IJ SOLN
30.0000 mg | Freq: Once | INTRAMUSCULAR | Status: AC
  Administered 2013-07-16: 30 mg via INTRAVENOUS
  Filled 2013-07-16: qty 1

## 2013-07-16 MED ORDER — METOCLOPRAMIDE HCL 5 MG/ML IJ SOLN
10.0000 mg | Freq: Once | INTRAMUSCULAR | Status: AC
  Administered 2013-07-16: 10 mg via INTRAVENOUS
  Filled 2013-07-16: qty 2

## 2013-07-16 MED ORDER — MECLIZINE HCL 50 MG PO TABS: 25.0000 mg | ORAL_TABLET | Freq: Three times a day (TID) | ORAL | Status: DC | PRN

## 2013-07-16 MED ORDER — DIPHENHYDRAMINE HCL 50 MG/ML IJ SOLN
25.0000 mg | Freq: Once | INTRAMUSCULAR | Status: AC
  Administered 2013-07-16: 25 mg via INTRAVENOUS
  Filled 2013-07-16: qty 1

## 2013-07-16 NOTE — ED Provider Notes (Signed)
CSN: [id number]     Arrival date & time 07/16/13  1623 History   First MD Initiated Contact with Patient 07/16/13 1628     Chief Complaint  Patient presents with  . Dizziness  . Blurred Vision   (Consider location/radiation/quality/duration/timing/severity/associated sxs/prior Treatment) HPI Comments: The patient presents to the ED with intermittent dizziness and blurry vision for 3 days (07/14/2013).  The patient reports the current episode occurred during an outpatient head CT scan when the patient reports getting anxious about possible negative results.  He describes the dizziness as vertigo and light headedness along with diplopia. His previous episode started at work 3 days ago and was relieved by sitting down until the symptoms resided. Reports frontal and temporal headache, constant 6/10, states typical of previous headaches.  He also complains of body aches for one year that are not relieved by naproxen or rest.  Reports drinking 5 12 oz beers, last ETOH 1300 today. Reports taking citalopram 20 mg BID, denies opioid, cocaine, heroin or other drug use. Denies chest pain, abdominal pain. The patient reports working 2nd and 3rd shift and increase in work hours recently up to 14 hours a day.    The patient was seen by his PCP yesterday with workup consisting of TSH, and B burgdorferi Ab IgG+IgM test, CMP, UA-dipstick, CBC w/ diff, Sed rate, CK-all normal.      Past Medical History  Diagnosis Date  . Anxiety   . Depression     failed zoloft   History reviewed. No pertinent past surgical history. Family History  Problem Relation Age of Onset  . Alcohol abuse Father   . Cancer Mother     skin  . Diabetes Neg Hx   . CAD Neg Hx   . Stroke Neg Hx    History  Substance Use Topics  . Smoking status: Current Some Day Smoker -- 0.50 packs/day for 10 years    Types: Cigarettes  . Smokeless tobacco: Never Used     Comment: Currently using e-cig  . Alcohol Use: Yes     Comment:  occasional    Review of Systems  All other systems reviewed and are negative.    Allergies  Review of patient's allergies indicates no known allergies.  Home Medications   Current Outpatient Rx  Name  Route  Sig  Dispense  Refill  . citalopram (CELEXA) 20 MG tablet   Oral   Take 1 tablet (20 mg total) by mouth daily.   30 tablet   6   . hydrOXYzine (ATARAX/VISTARIL) 25 MG tablet   Oral   Take 0.5-1 tablets (12.5-25 mg total) by mouth 3 (three) times daily as needed for anxiety.   40 tablet   0   . meclizine (ANTIVERT) 50 MG tablet   Oral   Take 0.5 tablets (25 mg total) by mouth 3 (three) times daily as needed for dizziness.   30 tablet   0   . traMADol (ULTRAM) 50 MG tablet   Oral   Take 1 tablet (50 mg total) by mouth 2 (two) times daily as needed for pain.   30 tablet   0    BP 108/66  Pulse 75  Temp(Src) 97.9 F (36.6 C) (Oral)  Resp 14  SpO2 97% Physical Exam  Nursing note and vitals reviewed. Constitutional: He is oriented to person, place, and time. He appears well-developed and well-nourished.  HENT:  Head: Normocephalic and atraumatic.  Right Ear: Tympanic membrane normal.  Left Ear: Tympanic membrane  normal.  Mouth/Throat: Oropharynx is clear and moist and mucous membranes are normal. No trismus in the jaw.  Eyes: EOM and lids are normal. No scleral icterus. Right eye exhibits normal extraocular motion. Left eye exhibits normal extraocular motion. Pupils are equal.  2 mm bilaterally. Decrease reactivity bilaterally.  Neck: Neck supple.  Cardiovascular: Normal rate, regular rhythm and normal heart sounds.   Pulmonary/Chest: Effort normal and breath sounds normal.  Abdominal: Soft. Bowel sounds are normal. He exhibits no distension. There is no tenderness. There is no rebound and no guarding.  Musculoskeletal: Normal range of motion.  Neurological: He is alert and oriented to person, place, and time. He is not disoriented. No cranial nerve deficit.   Reflex Scores:      Patellar reflexes are 2+ on the right side and 2+ on the left side. Decrease strength is upper and lower extremities, patient reports secondary to pain.  Skin: Skin is warm and dry.  Psychiatric: His speech is normal. His mood appears anxious.    ED Course  Procedures (including critical care time) Results for orders placed during the hospital encounter of 07/16/13  URINE RAPID DRUG SCREEN (HOSP PERFORMED)      Result Value Range   Opiates NONE DETECTED  NONE DETECTED   Cocaine NONE DETECTED  NONE DETECTED   Benzodiazepines NONE DETECTED  NONE DETECTED   Amphetamines NONE DETECTED  NONE DETECTED   Tetrahydrocannabinol NONE DETECTED  NONE DETECTED   Barbiturates NONE DETECTED  NONE DETECTED   Ct Angio Head W/cm &/or Wo Cm  07/16/2013   ADDENDUM REPORT: 07/16/2013 22:16  ADDENDUM: Mild mucosal thickening left maxillary sinus.  Lyla Son most notable posterior molars.   Electronically Signed   By: Bridgett Larsson M.D.   On: 07/16/2013 22:16   07/16/2013   CLINICAL DATA:  Six month history of intermittent dizziness, headaches and nausea with double vision which is becoming more frequent. Evaluate posterior circulation.  EXAM: CT ANGIOGRAPHY HEAD AND NECK  TECHNIQUE: Multidetector CT imaging of the head and neck was performed using the standard protocol during bolus administration of intravenous contrast. Multiplanar CT image reconstructions including MIPs were obtained to evaluate the vascular anatomy. Carotid stenosis measurements (when applicable) are obtained utilizing NASCET criteria, using the distal internal carotid diameter as the denominator.  CONTRAST:  50mL OMNIPAQUE IOHEXOL 350 MG/ML SOLN  COMPARISON:  07/16/2013 unenhanced head CT.  FINDINGS: CTA HEAD FINDINGS  Precontrast exam was not performed as patient had a noncontrast exam earlier in the day. No obvious intracranial hemorrhage. No hydrocephalus. No intracranial mass or abnormal enhancement.  Anterior circulation  without medium or large size vessel significant stenosis or occlusion.  Fetal type origin of the right posterior cerebral artery.  Basilar artery and vertebral arteries are patent.  No aneurysm or vascular malformation is noted.  Review of the MIP images confirms the above findings.  CTA NECK FINDINGS  No significant stenosis or irregularity of the carotid arteries or vertebral arteries.  No worrisome primary neck mass.  Review of the MIP images confirms the above findings.  IMPRESSION: CTA HEAD IMPRESSION  Anterior circulation without medium or large size vessel significant stenosis or occlusion.  Fetal type origin of the right posterior cerebral artery.  Basilar artery and vertebral arteries are patent.  No aneurysm or vascular malformation is noted.  CTA NECK IMPRESSION  No significant stenosis or irregularity of the carotid arteries or vertebral arteries.  Electronically Signed: By: Bridgett Larsson M.D. On: 07/16/2013 22:03   Ct  Head Wo Contrast  07/16/2013   CLINICAL DATA:  Six month history of intermittent dizziness, headache, nausea, and double vision which is becoming more frequent.  EXAM: CT HEAD WITHOUT CONTRAST  TECHNIQUE: Contiguous axial images were obtained from the base of the skull through the vertex without intravenous contrast.  COMPARISON:  None.  FINDINGS: There is no evidence of brain mass, brain hemorrhage, or acute infarction.  The ventricular system is normal size and shape. There is no evidence of shift of midline structures, parenchymal lesion, or subdural or epidural hematoma.  The calvarium is intact. Mastoids are well aerated. No sinusitis is evident.  IMPRESSION: There is no evidence of brain mass, brain hemorrhage, or acute infarction.  No acute or active process is seen. No skull lesion is evident. No sinusitis is evident.   Electronically Signed   By: Onalee Hua  Call M.D.   On: 07/16/2013 15:57   Ct Angio Neck W/cm &/or Wo/cm  07/16/2013   ADDENDUM REPORT: 07/16/2013 22:16  ADDENDUM:  Mild mucosal thickening left maxillary sinus.  Lyla Son most notable posterior molars.   Electronically Signed   By: Bridgett Larsson M.D.   On: 07/16/2013 22:16   07/16/2013   CLINICAL DATA:  Six month history of intermittent dizziness, headaches and nausea with double vision which is becoming more frequent. Evaluate posterior circulation.  EXAM: CT ANGIOGRAPHY HEAD AND NECK  TECHNIQUE: Multidetector CT imaging of the head and neck was performed using the standard protocol during bolus administration of intravenous contrast. Multiplanar CT image reconstructions including MIPs were obtained to evaluate the vascular anatomy. Carotid stenosis measurements (when applicable) are obtained utilizing NASCET criteria, using the distal internal carotid diameter as the denominator.  CONTRAST:  50mL OMNIPAQUE IOHEXOL 350 MG/ML SOLN  COMPARISON:  07/16/2013 unenhanced head CT.  FINDINGS: CTA HEAD FINDINGS  Precontrast exam was not performed as patient had a noncontrast exam earlier in the day. No obvious intracranial hemorrhage. No hydrocephalus. No intracranial mass or abnormal enhancement.  Anterior circulation without medium or large size vessel significant stenosis or occlusion.  Fetal type origin of the right posterior cerebral artery.  Basilar artery and vertebral arteries are patent.  No aneurysm or vascular malformation is noted.  Review of the MIP images confirms the above findings.  CTA NECK FINDINGS  No significant stenosis or irregularity of the carotid arteries or vertebral arteries.  No worrisome primary neck mass.  Review of the MIP images confirms the above findings.  IMPRESSION: CTA HEAD IMPRESSION  Anterior circulation without medium or large size vessel significant stenosis or occlusion.  Fetal type origin of the right posterior cerebral artery.  Basilar artery and vertebral arteries are patent.  No aneurysm or vascular malformation is noted.  CTA NECK IMPRESSION  No significant stenosis or irregularity of the  carotid arteries or vertebral arteries.  Electronically Signed: By: Bridgett Larsson M.D. On: 07/16/2013 22:03      MDM   1. Dizziness     The patient was seen by his PCP yesterday with workup consisting of TSH, and B burgdorferi Ab IgG+IgM test, CMP, UA-dipstick, CBC w/ diff, Sed rate, CK-all normal.  Head CT performed today- no abnormalities seen.   Dr. Hyacinth Meeker discussed patient condition with Neurology and advised on imaging and will give their recommendations after the CT is final.  1905 Re-eval: Patient resting in bed comfortably watching TV and agrees to pain medication and anti-anxiety medication prior to CT.  2228 Re-eval: Patient sleeping in bed. Discussed CT results with patient.  The patient understands to follow up with neurology in an out-patient clinic.  Meds given in ED:  Medications  metoCLOPramide (REGLAN) injection 10 mg (10 mg Intravenous Given 07/16/13 1946)  ketorolac (TORADOL) 30 MG/ML injection 30 mg (30 mg Intravenous Given 07/16/13 1945)  diphenhydrAMINE (BENADRYL) injection 25 mg (25 mg Intravenous Given 07/16/13 1946)  iohexol (OMNIPAQUE) 350 MG/ML injection 50 mL (50 mLs Intravenous Contrast Given 07/16/13 2104)    Discharge Medication List as of 07/16/2013 10:38 PM        Leotis Shames Doretha Imus, PA-C 07/20/13 1433

## 2013-07-16 NOTE — ED Provider Notes (Signed)
32 year old male presents to the hospital from the CT scan at the outpatient offices. He has had approximately one year of intermittent dizziness which he describes as feeling lightheaded as well as vertigo. This lasts less than 10 minutes, occurs occasionally with periods of feeling totally normal between them. Over the last year these have become more and more frequent, over the last week they have become even more frequent and on Sunday night he had approximately 6 episodes. This prompted them to followup with his family doctor who ordered a CT scan. The CT scan was performed today. While he was in the CT scan he became acutely anxious and short of breath, he felt vertiginous, began to have diplopia which he notes having with these vertiginous episodes. This resolved spontaneously, and the only symptoms that he is having at this time is that he has diffuse body aches which he states have been becoming more and more frequent over the last year. 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 He admits to drinking daily, he is alert he had 5 beers prior to arrival today. He denies any other drug abuse.  On exam the patient is clear eyes, conjunctiva are clear, pupils are equal round and minimally reactive, there proximally 2-3 mm in size bilaterally. His oropharynx is clear with moist mucous membranes, he has no facial droop, cranial nerves III through XII are intact. He has decreased strength in all 4 extremities though he states this is because of pain in his muscles. He is able to straight leg raise bilaterally, there is no asymmetric strength weaknesses. He has normal sensation in all 4 extremities, normal reflexes at the patellar tendons bilaterally, speech is clear, memory is intact.  CT scan was read as normal with no signs of mass or hemorrhage. Will discuss with neurology for further evaluation, check for rhabdomyolysis,  other laboratory abnormalities. Vital signs as below.  Neurology has seen the patient, Dr. Amada Jupiter recommends a CT angiogram of the neck and head.  CT scan of the head and neck performed with angiogram shows no signs of vascular stenosis, hemorrhage or aneurysm. Neurology believe that the patient appears stable for discharge if these tests are normal, the patient has since become asymptomatic and is able to ambulate, speak and perform normal tasks without difficulty, symptom-free at discharge.  Filed Vitals:   07/16/13 1650  BP: 129/83  Pulse: 80  Temp: 97.9 F (36.6 C)  Resp: 28   Medical screening examination/treatment/procedure(s) were conducted as a shared visit with non-physician practitioner(s) and myself.  I personally evaluated the patient during the encounter.  Clinical Impression: Dizziness      Vida Roller, MD 07/17/13 [id number]

## 2013-07-16 NOTE — Telephone Encounter (Signed)
Noted. Will await er eval.

## 2013-07-16 NOTE — Consult Note (Signed)
Neurology Consultation Reason for Consult: Episodic dizziness Referring Physician: Hyacinth Meeker, B.  CC: Dizziness  History is obtained from: Patient  HPI: Adrian Walton is a 32 y.o. male with a history of spells for the past 6-7 months the last for about 10 minutes. He states that he will have lightheadedness as well as vertigo and diplopia. This feeling will be persistent over the 10 minute period, and then resolve. He has never tried lying down to see if it aborts the spell. He has constant headache. When he has the spells he has a worsened headache which is associated with photophobia.  He also complains of diffuse body aches and arthralgias which started about a year ago.   ROS: A 14 point ROS was performed and is negative except as noted in the HPI.  Past Medical History  Diagnosis Date  . Anxiety   . Depression     failed zoloft    Family History: Mother-lupus  Social History: Tob: Positive for smoking  Exam: Current vital signs: BP 129/83  Pulse 80  Temp(Src) 97.9 F (36.6 C) (Oral)  Resp 28  SpO2 99% Vital signs in last 24 hours: Temp:  [97.9 F (36.6 C)] 97.9 F (36.6 C) (10/07 1650) Pulse Rate:  [80] 80 (10/07 1650) Resp:  [28] 28 (10/07 1650) BP: (129)/(83) 129/83 mmHg (10/07 1650) SpO2:  [98 %-99 %] 99 % (10/07 1650)  General: In bed, NAD CV: Regular in rhythm Mental Status: Patient is awake, alert, oriented to person, place, month, year, and situation. Immediate and remote memory are intact. Patient is able to give a clear and coherent history. No signs of aphasia or neglect Cranial Nerves: II: Visual Fields are full. Pupils are equal, round, and reactive to light.  Discs are sharp. III,IV, VI: EOMI without ptosis or diploplia.  V: Facial sensation is symmetric to temperature VII: Facial movement is symmetric.  VIII: hearing is intact to voice X: Uvula elevates symmetrically XI: Shoulder shrug is symmetric. XII: tongue is midline without atrophy or  fasciculations.  Motor: Tone is normal. Bulk is normal. Patient guards all movements due to muscle pain, but has at least 4/5 strength in all four extremities.  Sensory: Sensation is symmetric to light touch and temperature in the arms and legs. Deep Tendon Reflexes: 2+ and symmetric in the biceps and patellae.  Plantars: Toes are downgoing bilaterally.  Cerebellar: FNF intact bilaterally Gait: Not tested secondary multiple medical monitors in ED setting  I have reviewed labs in epic and the results pertinent to this consultation are: CMP - mildly elevated bilirubin, otherwise a normal ESR-5 TSH -nml  I have reviewed the images obtained: CT head-negative  Impression: 32 year old male with recurrent episodes of vertigo, diplopia, lightheadedness associated with headache. Given the frequency and duration of the spells, I think that the most likely diagnosis is complicated migraine. I would favor ruling out posterior circulation disease with a CT angiogram. Also with his arthralgias and myalgias, I do wonder about rheumatological disease.  Without a family history and with the character of the events, I think that episodic ataxia is unlikely.  Recommendations: 1) CT angiogram head and neck to assess for posterior circulation insufficiency 2) if this is negative, I would favor treating for migraine. I would favor metoprolol start at 25 twice a day for one week then increased to 50 twice a day if tolerating 3) would also give a prednisone or Medrol Dosepak given the frequency of the spells recently. 4) I would be concerned about  rheumatological disease given his myalgias and arthralgias, and would consider workup.   Ritta Slot, MD [facility] [phone]  If 7pm- 7am, please page neurology on call at [phone].

## 2013-07-16 NOTE — Progress Notes (Signed)
The patient was scheduled for a head CT today by Dr. Para March of primary care. He apparently has been having dizzy episodes, weak spells, headaches and double vision. On arrival I am told he was without complaints. He apparently had some anxiety during the CT scan. He subsequently complained of severe weakness, headache and double vision. His blood pressure is 130/80. His pulse is 88. Saturations are 98%. His neurological exam is difficult as he is somewhat uncooperative. I discussed the case with Dr. Para March and he asked that patient be sent to the emergency room for further evaluation. He may need neurological evaluation. Olga Millers

## 2013-07-16 NOTE — Telephone Encounter (Signed)
Call from Dr. Jens Som.  Pt had another episode of double vision and diffuse weakness at imaging site.  Per Dr. Jens Som sats are normal.  Given acute decline, advised ER.  He agrees.    Thanks to all involved.  Will forward to PCP as FYI.

## 2013-07-16 NOTE — ED Notes (Signed)
Per EMS pt is here from [place] where he was getting a Head CT for chronic HA, blurry vision and dizziness. During CT pt became anxious and started having increased blurry vision. Poss ETOH on board. Pupils were 2mm bilaterally in dark room and non reactive to light. Pt denies other drug use. AAO at this time.

## 2013-07-16 NOTE — ED Notes (Signed)
Pt ambulatory from EMS stretcher to ED stretcher with 1 staff member standing by for assistance. Pt had unsteady gait.

## 2013-07-16 NOTE — Assessment & Plan Note (Signed)
Now with episodes of brief double vision.  DHP mildly pos today.  Nontoxic.  Chronic aches.  Will start lab eval and check head CT.  D/w PCP and pt.  All agree with plan.  See notes on images and labs.  Out of work today and next two days.  >25 min spent with face to face with patient, >50% counseling and/or coordinating care.

## 2013-07-17 ENCOUNTER — Encounter: Payer: Self-pay | Admitting: *Deleted

## 2013-07-17 ENCOUNTER — Telehealth: Payer: Self-pay

## 2013-07-17 MED ORDER — TRAMADOL HCL 50 MG PO TABS: 50.0000 mg | ORAL_TABLET | Freq: Two times a day (BID) | ORAL | Status: DC | PRN

## 2013-07-17 NOTE — Telephone Encounter (Signed)
Pt left v/m; pt was seen on 07/15/13 by Dr Para March for dizziness, fatigue and pain thru out entire body. Pt was seen in ED on 07/16/13 and testing was done for dizziness but did not do testing for fatigue or pain thru out body. Pt request testing for fatigue and pain thru out body. Pt is to return to work on 06/18/13 and pt knows that will be painful; pt does want to work tomorrow.pt request what is next step.

## 2013-07-17 NOTE — Telephone Encounter (Signed)
Patient notified and letter written and placed up front for pick up. He was asking if there was anything you could give him for the pain just to get him through until neuro appt on Monday. He has tried tylenol, motrin, aleve and naproxen all with no relief.

## 2013-07-17 NOTE — Telephone Encounter (Signed)
May prescribe tramadol - to take 1/2 to 1 tablet as needed - caution may make him sleepy. plz phone in.

## 2013-07-17 NOTE — Telephone Encounter (Signed)
If G will put in the referral to neuro, I can write him out in the meantime.  Let me know what timeline is needed.   Okay to give patient note out of work through Monday for now.

## 2013-07-17 NOTE — Telephone Encounter (Signed)
He had overall normal testing for fatigue/pain with blood work done by Dr. Para March. When is appt with neurologist?  May need extended time off work. Will also route to Dr. Algis Downs for input.

## 2013-07-18 NOTE — Telephone Encounter (Signed)
Rx called in as directed and message left notifying patient. 

## 2013-07-18 NOTE — Telephone Encounter (Signed)
Thank you for handling this.

## 2013-07-20 NOTE — ED Provider Notes (Signed)
Medical screening examination/treatment/procedure(s) were conducted as a shared visit with non-physician practitioner(s) and myself.  I personally evaluated the patient during the encounter  Please see my separate respective documentation pertaining to this patient encounter   Vida Roller, MD 07/20/13 1437

## 2013-07-22 ENCOUNTER — Ambulatory Visit (INDEPENDENT_AMBULATORY_CARE_PROVIDER_SITE_OTHER): Payer: BC Managed Care – PPO | Admitting: Neurology

## 2013-07-22 ENCOUNTER — Telehealth: Payer: Self-pay | Admitting: Neurology

## 2013-07-22 ENCOUNTER — Encounter: Payer: Self-pay | Admitting: Neurology

## 2013-07-22 ENCOUNTER — Encounter: Payer: Self-pay | Admitting: *Deleted

## 2013-07-22 ENCOUNTER — Other Ambulatory Visit (INDEPENDENT_AMBULATORY_CARE_PROVIDER_SITE_OTHER): Payer: BC Managed Care – PPO

## 2013-07-22 ENCOUNTER — Telehealth: Payer: Self-pay

## 2013-07-22 VITALS — BP 100/64 | HR 78 | Temp 98.2°F | Ht 72.0 in | Wt 153.0 lb

## 2013-07-22 DIAGNOSIS — R52 Pain, unspecified: Secondary | ICD-10-CM

## 2013-07-22 DIAGNOSIS — R5381 Other malaise: Secondary | ICD-10-CM

## 2013-07-22 DIAGNOSIS — G43109 Migraine with aura, not intractable, without status migrainosus: Secondary | ICD-10-CM

## 2013-07-22 DIAGNOSIS — R42 Dizziness and giddiness: Secondary | ICD-10-CM

## 2013-07-22 DIAGNOSIS — M2550 Pain in unspecified joint: Secondary | ICD-10-CM

## 2013-07-22 MED ORDER — TOPIRAMATE 50 MG PO TABS: ORAL_TABLET | ORAL | Status: DC

## 2013-07-22 NOTE — Addendum Note (Signed)
Addended by: Alvina Chou on: 07/22/2013 04:36 PM   Modules accepted: Orders

## 2013-07-22 NOTE — Telephone Encounter (Signed)
Letter written and placed up front for pick up  

## 2013-07-22 NOTE — Telephone Encounter (Signed)
Mrs Magid left v/m that pt has seen 6 doctors and now pt has no more money for copays or medication. Mrs Nikolai is concerned symptoms; dizziness, h/a, pain and lost control of bowels have not been addressed. Mrs Brisbin said pt is to return to work 07/23/13; Mrs Hillis wants cb and wants to know what is wrong with her husband today.Please advise.

## 2013-07-22 NOTE — Patient Instructions (Addendum)
I think you probably have migraines.  The body pain does not seem neurological, however.  1.  We will start topiramate (Topamax) 50mg  tablets.  We will increase the dose as follows to goal of 100mg  twice daily:      Morning Evening Week 1:     0.5 tab Week 2:    0.5 tab  0.5 tab Week 3:   0.5 tab  1 tab Week 4 and thereafter 1 tab  1 tab.  Possible side effects include: impaired thinking, sedation, paresthesias (numbness and tingling) and weight loss.  It may cause dehydration and there is a small risk for kidney stones, so make sure to stay hydrated with water during the day.  There is also a very small risk for glaucoma, so if you notice any change in your vision while taking this medication, see an ophthalmologist.  There is also a very small risk of possible suicidal ideation, as it the case with all antiepileptic medications.  Call in 6 weeks with an update.  2.  Referral to rheumatology for body pain--I will let you know about your referral once completed.  3.  Follow up in 3 months.

## 2013-07-22 NOTE — Telephone Encounter (Signed)
Spoke with Adrian Walton and let her know referral to Rheumatology was made to [facility]. They should call to schedule the appointment. Asked her to let us know if they have not heard from them in a week. She states she will.

## 2013-07-22 NOTE — Telephone Encounter (Signed)
Spoke with Denny Peon pt's wife. BM incontinence x 3 over last week. Having persistent headaches.  Improve with tylenol/ibuprofen. Unable to afford topamax.   Unable to return to work 2/2 dizziness, pain. Mixed tramadol with EtoH last night - advised not to do this. I will write him out for 1 more week until next Monday and have asked him to come in for lab visit today - Kim please get letter ready for them to pick up today (out of work for the next week).

## 2013-07-22 NOTE — Progress Notes (Signed)
NEUROLOGY CONSULTATION NOTE  Adrian Walton MRN: [id number] DOB: [birth year]  Referring provider: ED Primary care provider: Eustaquio Boyden  Reason for consult:  Episodic dizziness  HISTORY OF PRESENT ILLNESS: Adrian Walton is a 32 year old left-handed man with headaches, alcohol use, anxiety and depression who presents for evaluation of dizziness and blurred vision.  He is accompanied by his wife.  Records and images were personally reviewed where available.    He has two issues: 1.  Episodic dizziness:  Ongoing for at least a year.  He presents with lightheadedness and spinning sensation.  It is usually accompanied by dull throbbing bi-frontal headache that radiates to top of head.  It will then progress up to 7-8/10 intensity.  It is sometimes associated with nausea.  He is unsteady on his feet and has tripped during a spell.  It is not positional.  No photophobia or phonophobia.  No slurred speech.The dizzy spell lasts 5-10 minutes but the headache can persist for several hours (1 hour with treatment).  Over the past two weeks, episodes more intense and are now accompanied by blurred vision/horizontal binocular diplopia, as well as nausea leading to vomiting.  He has tried Tylenol and ibuprofen in past, with varying success.  No prior history of headaches but his mother has history of migraine.  He also has at least one year history of diffuse myalgias and joint pain.  He denies numbness and tingling.  No real weakness but unable to move well due to severe pain.  No associated fevers or rash.  He presented to the ED on 07/16/13 for intermittent dizziness and blurred vision.  Onset was about 2 weeks ago.  At an outpatient CT scan, he began having an episode which was triggered by anxiety regarding a possible serious etiology.  This prompted visit to the ED.  He also notes diffuse body aches of one year duration.  He has been working 2nd and 3rd shift with increased work hours up to 14 hour  days.  He takes citalopram 20mg  BID.  He denies drug use.  He does drink daily.  As per ED note, he reported having drank 5 12 oz beers.  He was given tramadol.  He has been out of work over the past week due to the body pain and dizzy spells.  Recent workup including TSH, Lyme, CMP, UA dipstick, CBC w/diff, ESR and CK normal.  07/16/13 CTA Head:  Anterior circulation without medium or large size vessel significant stenosis or occlusion. Fetal type origin of the right posterior cerebral artery.  Basilar artery and vertebral arteries are patent.  No aneurysm or vascular malformation is noted.   07/16/13 CTA Neck:  No significant stenosis or irregularity of the carotid arteries or vertebral arteries.  07/16/13 CT Head:  Negative.  PAST MEDICAL HISTORY: Past Medical History  Diagnosis Date  . Anxiety   . Depression     failed zoloft    PAST SURGICAL HISTORY: No past surgical history on file.  MEDICATIONS: Current Outpatient Prescriptions on File Prior to Visit  Medication Sig Dispense Refill  . citalopram (CELEXA) 20 MG tablet Take 1 tablet (20 mg total) by mouth daily.  30 tablet  6  . hydrOXYzine (ATARAX/VISTARIL) 25 MG tablet Take 0.5-1 tablets (12.5-25 mg total) by mouth 3 (three) times daily as needed for anxiety.  40 tablet  0  . meclizine (ANTIVERT) 50 MG tablet Take 0.5 tablets (25 mg total) by mouth 3 (three) times daily as needed for dizziness.  30 tablet  0  . traMADol (ULTRAM) 50 MG tablet Take 1 tablet (50 mg total) by mouth 2 (two) times daily as needed for pain.  30 tablet  0   No current facility-administered medications on file prior to visit.    ALLERGIES: No Known Allergies  FAMILY HISTORY: Family History  Problem Relation Age of Onset  . Alcohol abuse Father   . Cancer Mother     skin  . Diabetes Neg Hx   . CAD Neg Hx   . Stroke Neg Hx     SOCIAL HISTORY: History   Social History  . Marital Status: Married    Spouse Name: N/A    Number of Children:  N/A  . Years of Education: N/A   Occupational History  . Not on file.   Social History Main Topics  . Smoking status: Current Some Day Smoker -- 0.50 packs/day for 10 years    Types: Cigarettes  . Smokeless tobacco: Never Used     Comment: Currently using e-cig  . Alcohol Use: Yes     Comment: occasional  . Drug Use: No  . Sexual Activity: Yes    Partners: Female   Other Topics Concern  . Not on file   Social History Narrative   Caffeine: 2 energy drinks, 2 cups coffee, 2-3 sodas   Lives with wife, and son, 4 dogs, 2 cats   Occupation: Barrister's clerk   Edu: HS   Activity: stays active at work   Diet: good water, fruits/vegetables occasionally    REVIEW OF SYSTEMS: Constitutional: No fevers, chills, or sweats, no generalized fatigue, change in appetite Eyes: No visual changes, double vision, eye pain Ear, nose and throat: No hearing loss, ear pain, nasal congestion, sore throat Cardiovascular: No chest pain, palpitations Respiratory:  No shortness of breath at rest or with exertion, wheezes GastrointestinaI: No nausea, vomiting, diarrhea, abdominal pain, fecal incontinence Genitourinary:  No dysuria, urinary retention or frequency Musculoskeletal:  No neck pain, back pain Integumentary: No rash, pruritus, skin lesions Neurological: as above Psychiatric: No depression, insomnia, anxiety Endocrine: No palpitations, fatigue, diaphoresis, mood swings, change in appetite, change in weight, increased thirst Hematologic/Lymphatic:  No anemia, purpura, petechiae. Allergic/Immunologic: no itchy/runny eyes, nasal congestion, recent allergic reactions, rashes  PHYSICAL EXAM: Filed Vitals:   07/22/13 0858  BP: 100/64  Pulse: 78  Temp: 98.2 F (36.8 C)   General: No acute distress Head:  Normocephalic/atraumatic Neck: supple, paraspinal tenderness, full range of motion Back: paraspinal tenderness Heart: regular rate and rhythm Lungs: Clear to auscultation  bilaterally. Vascular: No carotid bruits. Neurological Exam: Mental status: alert and oriented to person, place, and time, speech fluent and not dysarthric, language intact. Cranial nerves: CN I: not tested CN II: pupils equal, round and reactive to light, visual fields intact, fundi unremarkable. CN III, IV, VI:  full range of motion, no nystagmus, no ptosis CN V: facial sensation intact CN VII: upper and lower face symmetric CN VIII: hearing intact CN IX, X: gag intact, uvula midline CN XI: sternocleidomastoid and trapezius muscles intact CN XII: tongue midline Bulk & Tone: normal, no fasciculations. Motor: limited due to pain, but strength 5/5 throughout.  Muscle tender to palpation but seems more tender at joints and tenderpoints along the joints. Sensation: temperature and vibration intact Deep Tendon Reflexes: 2+ throughout, toes down Finger to nose testing: normal Gait: antalgic careful gait due to pain, no ataxia, difficulty with tandem due to pain. Romberg negative.  IMPRESSION: 1.  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 2.  Diffuse myalgia and joint pain  PLAN: 1.  Will start topamax for preventative care. 2.  Try Excedrin migraine at earliest onset of headache.  Do not exceed pain meds more than 2 days out of week to prevent rebound headache 3.  Referral to rheumatology for diffuse body aches.  It does not seem neurological such as neuropathy or myopathy. 4.  Follow up in 3 months.   Thank you for allowing me to take part in the care of this patient.  Shon Millet, DO  CC:  Eustaquio Boyden, MD

## 2013-07-23 ENCOUNTER — Telehealth: Payer: Self-pay | Admitting: Neurology

## 2013-07-23 LAB — HIGH SENSITIVITY CRP: CRP, High Sensitivity: 0.65 mg/L (ref 0.000–5.000)

## 2013-07-23 LAB — SEDIMENTATION RATE: Sed Rate: 6 mm/hr (ref 0–22)

## 2013-07-23 NOTE — Telephone Encounter (Addendum)
Ok to extend note out of work until appt with rheumatologist.  Would like them to see me after rheum. - 30 min appt

## 2013-07-23 NOTE — Telephone Encounter (Signed)
Adrian Walton left v/m; pt has appt with Dr Nickola Major rheumatologist on 08/01/13 at 2 pm. Adrian Walton wants to know if Dr Reece Agar will extend note to be out of work until 08/01/13 when sees rheumatologist.Please advise.Adrian Walton request cb when note is written.

## 2013-07-23 NOTE — Telephone Encounter (Signed)
Left a message re: Rheumatology referral at [facility] located at [address] [facility] in [place]. Appointment is for 10/23 at 2:15 pm with Dr. Nickola Major. Left their phone number as well. Asked that he call if questions.

## 2013-07-24 ENCOUNTER — Encounter: Payer: Self-pay | Admitting: *Deleted

## 2013-07-24 LAB — COMPLEMENT, TOTAL: Compl, Total (CH50): >60 U/mL — ABNORMAL HIGH (ref 31–60)

## 2013-07-24 NOTE — Telephone Encounter (Signed)
Patient notified. Letter written and placed up front for pick up. Appt scheduled.

## 2013-07-25 ENCOUNTER — Encounter: Payer: Self-pay | Admitting: Family Medicine

## 2013-07-29 ENCOUNTER — Telehealth: Payer: Self-pay

## 2013-07-29 MED ORDER — HYDROCODONE-ACETAMINOPHEN 5-325 MG PO TABS: 1.0000 | ORAL_TABLET | Freq: Three times a day (TID) | ORAL | Status: DC | PRN

## 2013-07-29 NOTE — Telephone Encounter (Signed)
Erin left v/m to let Dr Sharen Hones know pt has a fever today, did not take temp but pt was feeling hot and face was flushed and eyes look sunk in his face. Pt not feeling well.pt woke up at 4 AM due to pain all over his body in muscles and joints. Pt still has appetite. Pt does not have money to come to office today; pt has f/u appt with Dr Reece Agar on 08/02/13.

## 2013-07-29 NOTE — Telephone Encounter (Signed)
Please check temperature.  If fever, let us know.  May use ibuprofen 400-600mg  at a time for this. Let's come off celexa.  Decrease to 1/2 tablet daily for next 1 week then stop. If tramadol not helping, recommend stop this. Lab Results  Component Value Date   CREATININE 1.0 07/15/2013

## 2013-07-29 NOTE — Telephone Encounter (Addendum)
I spoke with patient - recommend ibuprofen 600mg  tid with food for pain. rec stop tramadol as not helping. ?SSRI mediated myalgias and serotonin excess with tramadol use - rec slowly back off celexa - decrease to 10mg  daily (1/2 tab) for next 1-2 wks then stop. Advised to keep [place] updated. Printed out prescription for vicodin prn breakthrough pain after ibuprofen for pt to pick up tomorrow.

## 2013-07-29 NOTE — Telephone Encounter (Signed)
Spoke with patient. Unknown temp-but per MIL-patient felt very hot and looked flushed. He feels "out of it", groggy and tired. He hasn't taken anything but the tramadol (which isn't working-other than to take the edge off). He asks if there is anything he should be doing or if there is anything else he can take for the pain. He is aware that any other Rx will require written Rx. He hasn't tried tylenol/ibuprofen for (?)fever today.

## 2013-07-30 NOTE — Telephone Encounter (Signed)
Rx placed up front for pick up. 

## 2013-08-01 LAB — RHEUMATOID FACTOR: Rhuematoid fact SerPl-aCnc: 11.9

## 2013-08-01 LAB — HEPATITIS C ANTIBODY: Hepatitis C Ab: NEGATIVE

## 2013-08-01 LAB — HEPATITIS A ANTIBODY, IGM: Hep A IgM: NEGATIVE

## 2013-08-01 LAB — CYCLIC CITRUL PEPTIDE ANTIBODY, IGG: Cyclic Citrullin Peptide Ab: <1

## 2013-08-01 LAB — HLA-B27 ANTIGEN: HLA-B27: NEGATIVE

## 2013-08-01 LAB — CREATININE KINASE MB: Total CK: 112 U/L (ref ?–195.0)

## 2013-08-02 ENCOUNTER — Ambulatory Visit (INDEPENDENT_AMBULATORY_CARE_PROVIDER_SITE_OTHER): Payer: BC Managed Care – PPO | Admitting: Family Medicine

## 2013-08-02 ENCOUNTER — Telehealth: Payer: Self-pay | Admitting: *Deleted

## 2013-08-02 ENCOUNTER — Encounter: Payer: Self-pay | Admitting: *Deleted

## 2013-08-02 ENCOUNTER — Encounter: Payer: Self-pay | Admitting: Family Medicine

## 2013-08-02 VITALS — BP 124/82 | HR 88 | Temp 98.1°F | Wt 159.5 lb

## 2013-08-02 DIAGNOSIS — R42 Dizziness and giddiness: Secondary | ICD-10-CM

## 2013-08-02 DIAGNOSIS — R52 Pain, unspecified: Secondary | ICD-10-CM

## 2013-08-02 LAB — HEPATITIS B SURFACE ANTIGEN: Hepatitis B Surface Antigen: NEGATIVE

## 2013-08-02 NOTE — Patient Instructions (Signed)
Return to see me in 3-4 weeks. I do recommend trial of topamax for dizziness. Continue vicodins for breakthrough pain. Continue celexa 1/2 tablet daily for 1 more week then stop. Goal will be return to work light duty 08/12/2013. I will await records from rheumatology.

## 2013-08-02 NOTE — Progress Notes (Signed)
Subjective:    Patient ID: Adrian Walton, male    DOB: 1981-07-28, 32 y.o.   MRN: [id number]  HPI CC: f/u myalgias and dizziness  6+ mo h/o persistent myalgias, arthralgias, dizziness, and double vision (rare).  Dizzy episodes described as lightheaded and room spinning.  Last minutes.  Were worse when he was working.  Diffuse arthralgias/myalgias throughout body.  Lower back and knees hurt worse than other areas.  Feels like soreness after working out at gym - has not worked out recently.  Energy level down.  Appetite down.  On hydrocodone which helps with his pain.  No recent fevers, new rashes, abd pain, chest pain, dyspnea, nausea/vomiting, diarrhea/constipation, night sweats, weight changes.   Wt Readings from Last 3 Encounters:  08/02/13 159 lb 8 oz (72.349 kg)  07/22/13 153 lb (69.4 kg)  07/15/13 157 lb 8 oz (71.442 kg)    Recent extensive workup including CMP, CBC, CK, TSH, ESR, ANA, CRP, lyme titer, UA. Head CT w/o contrast - no acute findings Head/neck CTA - no acute findings Saw neuro (Dr. Everlena Cooper) - thought vertiginous migraines Saw rheum yesterday (Dr. Nickola Major and PA Juanita Craver) - further blood work obtained.  Rec f/u in 3 wks (08/21/2013).  Pending records.  Has stopped tramadol.  Has decreased celexa to 10mg  daily for last 3 days  EtOH - decreased recently - down to 1-2 beers every day.  No other alcohol. Smoking - e cig, 1/2 ppd. Denies rec drugs.  Out of work since 07/15/2013. 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  Past Medical History  Diagnosis Date  . Anxiety   . Depression     failed zoloft   No past surgical history on file.  Family History  Problem Relation Age of Onset  . Alcohol abuse Father   . Cancer Mother     skin  . Diabetes Neg Hx   . CAD Neg Hx   . Stroke Neg Hx     History   Social History  . Marital Status: Married    Spouse Name: N/A    Number of Children: N/A  . Years of Education: N/A   Occupational  History  . Not on file.   Social History Main Topics  . Smoking status: Current Some Day Smoker -- 0.50 packs/day for 10 years    Types: Cigarettes  . Smokeless tobacco: Never Used     Comment: Currently using e-cig  . Alcohol Use: Yes     Comment: occasional  . Drug Use: No  . Sexual Activity: Yes    Partners: Female   Other Topics Concern  . Not on file   Social History Narrative   Caffeine: 2 energy drinks, 2 cups coffee, 2-3 sodas   Lives with wife, and son, 4 dogs, 2 cats   Occupation: Barrister's clerk   Edu: HS   Activity: stays active at work   Diet: good water, fruits/vegetables occasionally     Review of Systems Per HPI    Objective:   Physical Exam  Nursing note and vitals reviewed. Constitutional: He is oriented to person, place, and time. He appears well-developed and well-nourished. No distress.  HENT:  Head: Normocephalic and atraumatic.  Right Ear: Tympanic membrane, external ear and ear canal normal.  Left Ear: Tympanic membrane, external ear and ear canal normal.  Nose: Nose normal. No mucosal edema or rhinorrhea. Right sinus exhibits no maxillary sinus tenderness and no frontal sinus tenderness. Left sinus exhibits no maxillary sinus  tenderness and no frontal sinus tenderness.  Mouth/Throat: Uvula is midline and mucous membranes are normal. Dental caries present. Posterior oropharyngeal erythema present. No oropharyngeal exudate, posterior oropharyngeal edema or tonsillar abscesses.  Eyes: Conjunctivae and EOM are normal. Pupils are equal, round, and reactive to light. No scleral icterus.  Neck: Normal range of motion. Neck supple.  Cardiovascular: Normal rate, regular rhythm, normal heart sounds and intact distal pulses.   No murmur heard. Pulmonary/Chest: Effort normal and breath sounds normal. No respiratory distress. He has no wheezes. He has no rales.  Musculoskeletal: He exhibits no edema.  No active synovitis. Tender diffusely but preserved  ROM of all joints.  Lymphadenopathy:    He has no cervical adenopathy.  Neurological: He is alert and oriented to person, place, and time. No cranial nerve deficit.  CN 2-12 intact Nl FTN  Skin: Skin is warm and dry. No rash noted.       Assessment & Plan:

## 2013-08-02 NOTE — Telephone Encounter (Signed)
Patient called and said that he spoke with [place]. They advised they had sent paperwork for completion to you and he was asking if you received it. He also advised that his job has no light duty available after checking with his boss.

## 2013-08-04 ENCOUNTER — Encounter: Payer: Self-pay | Admitting: Family Medicine

## 2013-08-04 DIAGNOSIS — G43109 Migraine with aura, not intractable, without status migrainosus: Secondary | ICD-10-CM | POA: Insufficient documentation

## 2013-08-04 NOTE — Assessment & Plan Note (Signed)
Thought vertiginous migraines vs migraine with brainstem aura by neurology.  Started on topamax but he has not started taking yet.  Advised to start slow titration of topamax - will reassess dizziness on this med.

## 2013-08-04 NOTE — Assessment & Plan Note (Signed)
Unclear etiology of diffuse myalgias.  Workup so far unrevealing.  Will await evaluation by rheumatology. ?SSRI related as sxs started after celexa was started - we have begun wean off celexa currently taking 3m (decreased from prior 20mg ).  Will continue 10mg  for the next week, the discontinue SSRI. Treating body aches with hydrocodone prn - but pt cannot return to work on this med. Suggested try and minimize use while we stop celexa Will recommend he return to work light duty on 08/12/2013 for 2 wks then increase to full duty mid November.

## 2013-08-04 NOTE — Telephone Encounter (Signed)
Received form on Friday Filled and placed in my out box.

## 2013-08-05 NOTE — Telephone Encounter (Signed)
Adrian Walton with [employer] was calling to ck on status of disability paperwork; advised faxed today.

## 2013-08-05 NOTE — Telephone Encounter (Addendum)
Mrs Chiarelli left v/m requesting status of disability information to [employer]; pt's wife said electricity is to be cut off 08/06/13 and rent payment one month late,needing disability finalized so can get disabililty check. Lyla Son said may have been faxed earlier today. Pt will check with [employer] to be sure info received.

## 2013-08-06 ENCOUNTER — Telehealth: Payer: Self-pay | Admitting: *Deleted

## 2013-08-06 NOTE — Telephone Encounter (Signed)
Patient called and said STD did not get approved. They said that pain was not enough of a diagnosis to keep him from working. He was asking if you received records from rheumatology yet. If so, he would like for those to be faxed to [facility] because they are keeping the claim open for him in order to receive them to see if that will make any difference in his claim.   He also asks for an extension on his work note since there is no light duty available for him. His follow up with rheumatology is 08-21-13.   He also asks for a note to be written to his landlord explaining that he is out of work for medical reasons and is waiting on disability approval, which is why he is behind on his rent. He is hoping this will help decrease some of his stress.   Please call when notes are ready.

## 2013-08-07 ENCOUNTER — Other Ambulatory Visit: Payer: Self-pay | Admitting: *Deleted

## 2013-08-07 MED ORDER — HYDROCODONE-ACETAMINOPHEN 5-325 MG PO TABS: 1.0000 | ORAL_TABLET | Freq: Three times a day (TID) | ORAL | Status: DC | PRN

## 2013-08-07 MED ORDER — HYDROCODONE-ACETAMINOPHEN 5-325 MG PO TABS: 1.0000 | ORAL_TABLET | Freq: Two times a day (BID) | ORAL | Status: DC | PRN

## 2013-08-07 NOTE — Telephone Encounter (Signed)
He is taking too much if consistently taking three times a day.  This is only for breakthrough pain, no more than twice a day - needs to start with ibuprofen 600mg  I will prescribe #40 but this should last him the entire next month - no more refills until December 2014.

## 2013-08-07 NOTE — Telephone Encounter (Signed)
OK to refill? Patient coming to pick up note for work and would like to pick up Rx at the same time if possible.

## 2013-08-07 NOTE — Telephone Encounter (Signed)
Patient notified. Rx placed up front for pick up. Advised him no more RF's until December. He verbalized understanding.

## 2013-08-07 NOTE — Telephone Encounter (Signed)
Spoke with patient again who advised he spoke with Dr. Wallace Cullens at rheumatology today. She gave him his lab results and told him they were normal along with negative xrays. She said form her (rheumatology) stand point, there wasn't anything she could do and said he didn't need to follow up, so she cancelled his follow up on 08-21-13. He was asking about a possible pain clinic referral if you thought this was the route to go. [employer] also did not receive 2nd set of paperwork. It hasn't been scanned into the system yet, so I can't refax it.

## 2013-08-07 NOTE — Telephone Encounter (Signed)
Did they receive the second form I filled out?  He has more than just pain.   Rheum notes received but not scanned yet.  When they are scanned, please fax them to [place].  Ok to write letters for work (new work note printed out) and for landlord. Printed letters and placed in Kim's box.

## 2013-08-07 NOTE — Telephone Encounter (Signed)
Spoke with patient. He will call [employer] to make sure they are using the 2nd form and not the first. Notes placed up front for pick up. Will fax Rheum notes when scanned into chart.

## 2013-08-09 ENCOUNTER — Telehealth: Payer: Self-pay | Admitting: Family Medicine

## 2013-08-09 NOTE — Telephone Encounter (Signed)
FMLA forms were faxed for pt.  I have completed these forms, however there are a couple sections that I thought Dr. Sharen Hones needs to review/complete.  Once he has reviewed and signed these forms please return to me.  I have placed the forms in a green folder on your desk.  Thank you.

## 2013-08-09 NOTE — Telephone Encounter (Signed)
STD paperwork and notes re-faxed to [facility].

## 2013-08-09 NOTE — Telephone Encounter (Signed)
Forms in your IN box for completion. 

## 2013-08-09 NOTE — Telephone Encounter (Addendum)
plz notify - let's keep appointment in November and we will discuss.   rheum note is now in chart under media section. We can make sooner appointment if desired.

## 2013-08-09 NOTE — Telephone Encounter (Signed)
Spoke with patient and rescheduled appt to sooner time.

## 2013-08-11 NOTE — Telephone Encounter (Signed)
Filled and placed in my box in folder to return to [facility].

## 2013-08-12 ENCOUNTER — Telehealth: Payer: Self-pay | Admitting: *Deleted

## 2013-08-12 ENCOUNTER — Encounter: Payer: Self-pay | Admitting: Radiology

## 2013-08-12 NOTE — Telephone Encounter (Signed)
Patient called and wanted you to be aware that STD was denied because [employer] said his problems were not significant to be out of work. He wanted to make you aware because he is strongly considering an attorney. He will keep his appt with you tomorrow.

## 2013-08-13 ENCOUNTER — Encounter: Payer: Self-pay | Admitting: Family Medicine

## 2013-08-13 ENCOUNTER — Ambulatory Visit (INDEPENDENT_AMBULATORY_CARE_PROVIDER_SITE_OTHER): Payer: BC Managed Care – PPO | Admitting: Family Medicine

## 2013-08-13 VITALS — BP 130/90 | HR 108 | Temp 98.5°F | Wt 164.5 lb

## 2013-08-13 DIAGNOSIS — R52 Pain, unspecified: Secondary | ICD-10-CM

## 2013-08-13 DIAGNOSIS — G43109 Migraine with aura, not intractable, without status migrainosus: Secondary | ICD-10-CM

## 2013-08-13 DIAGNOSIS — F418 Other specified anxiety disorders: Secondary | ICD-10-CM

## 2013-08-13 DIAGNOSIS — F341 Dysthymic disorder: Secondary | ICD-10-CM

## 2013-08-13 MED ORDER — AMITRIPTYLINE HCL 25 MG PO TABS: 25.0000 mg | ORAL_TABLET | Freq: Every day | ORAL | Status: DC

## 2013-08-13 NOTE — Telephone Encounter (Signed)
Seen today.  See office note.

## 2013-08-13 NOTE — Progress Notes (Signed)
  Subjective:    Patient ID: Adrian Walton, male    DOB: 1981/05/14, 32 y.o.   MRN: [id number]  HPI CC: f/u diffuse body/joint pains  STD has been denied.  Pt planning on appealing decision.  Significant financial concerns contributing to stress.  6+ mo h/o persistent myalgias, arthralgias, dizziness/vertigo, and double vision (rare).  Diffuse arthralgias/myalgias throughout body. Lower back and knees hurt worse than other areas. Also painful in arms and neck. severe soreness.  Feels like muscle soreness after working out at gym - despite not working out. Out of work since 07/15/2013.  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 Endorses mental fogginess - trouble concentrating - going on for several months. Intermittent headache present intermittently associated with episodes of vertigo.  We stopped celexa and tramadol 2/2 concern for serotonin syndrome causing some of his symptoms - has titrated completely off serotonergic meds over last few days.  Denies improvement of pain.  Overall slightly improved vertigo/dizziness. Has started topamax over the last week in hopes of decreasing vertigo/migraine headaches.  No recent double vision episode.  Last episode of vertigo was 1 week ago.  Hydrocodone improves pain - to point of being able to move and function during the day.  Recent extensive workup including CMP, CBC, CK, TSH, ESR, ANA, CRP, lyme titer, UA.  Head CT w/o contrast - no acute findings  Head/neck CTA - no acute findings  Saw neuro (Dr. Everlena Cooper) - thought vertiginous migraines (migraines with brainstem aura)  Saw rheum (Dr. Nickola Major) - further blood work obtained including normal acute viral hepatitis panel, HLA B27 negative, CK normal, RF normal, normal uric acid, and normal anti CCP Abs.  No rheum cause found for his pain. Workup so far unrevealing.  Past Medical History   Diagnosis Date  . Anxiety   . Depression     failed zoloft   No past surgical history on file. Family History  Problem Relation Age of Onset  . Alcohol abuse Father   . Cancer Mother     skin  . Diabetes Neg Hx   . CAD Neg Hx   . Stroke Neg Hx    Review of Systems Per HPI    Objective:   Physical Exam  Nursing note and vitals reviewed. Constitutional: He appears well-developed and well-nourished.  Stiff movements throughout worse with transitions  HENT:  Mouth/Throat: Oropharynx is clear and moist. No oropharyngeal exudate.  Eyes: Conjunctivae and EOM are normal. Pupils are equal, round, and reactive to light. No scleral icterus.  Cardiovascular: Normal rate, regular rhythm, normal heart sounds and intact distal pulses.   No murmur heard. Pulmonary/Chest: Effort normal and breath sounds normal. No respiratory distress. He has no wheezes. He has no rales. He exhibits tenderness.  Tender to chest wall palpation  Abdominal: Soft. He exhibits no distension and no mass. There is tenderness. There is no rebound and no guarding.  Diffuse mild discomfort to abdominal wall palpation  Musculoskeletal: He exhibits no edema.  Diffuse pain to palpation throughout entire body at muscles and joints. Notable along lumbar spine, neck, and knees. FROM at knees without crepitus, erythema, swelling. No active synovitis appreciated  Skin: Rash noted.  Resolving erythematous macules on bilateral lower legs       Assessment & Plan:

## 2013-08-13 NOTE — Assessment & Plan Note (Addendum)
Given workup unrevealing, I suspect he actually has fibromyalgia type diagnosis - discussed this with patient. Discussed hesitance to use narcotics with this diagnosis - will continue vicodin for now, but did discuss with patient need to eventually wean off narcotic. I suggested he appeal STD denial decision - recent pain as well as recent vertigo episodes necessitated prolonged work leave, I will fill out any forms required to resubmit request. Pt will keep me updated over next few months on status of sxs and work situation. If able to afford, would consider trial of lyrica vs cymbalta.  Start elavil as cheaper alternative, hopeful for some pain modulation with this med.  ADDENDUM ==> patient requests referral to Dr. Gavin Potters rheumatologist for second opinion and further evaluation - have placed. Our plan is to wean him off narcotics so he can return to work as he is unable to take these meds at current job.  Hopeful to return to work off narcotics either December 2014 or January 2015.

## 2013-08-13 NOTE — Assessment & Plan Note (Signed)
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 Started on topamax to help decrease recurrence - seems to be helping. Encouraged continued topamax.

## 2013-08-13 NOTE — Assessment & Plan Note (Addendum)
Completely off tramadol and celexa (thought combination may have caused some of his sxs, but no improvement noted so far) - will monitor off these meds for now, but may consider re introduction of meds in future. Given persistent mood issues - especially anger and irritabiilty noticeable by wiffe - will start elavil in hopes for improved pain perception and mood. rtc 3-4 wks for f/u

## 2013-08-13 NOTE — Patient Instructions (Addendum)
Let's continue topamax. Let's continue hydrocodone for now. Start amitriptyline 25mg  nightly for 1 week then may increase to 50mg  nightly. I do think the diagnosis is fibromyalgia as well as vertiginous migraines. I recommend appealing decision and send me forms that need to be filled out.

## 2013-08-14 ENCOUNTER — Encounter: Payer: Self-pay | Admitting: *Deleted

## 2013-08-15 ENCOUNTER — Other Ambulatory Visit: Payer: Self-pay

## 2013-08-20 ENCOUNTER — Telehealth: Payer: Self-pay | Admitting: *Deleted

## 2013-08-20 DIAGNOSIS — M797 Fibromyalgia: Secondary | ICD-10-CM

## 2013-08-20 DIAGNOSIS — R52 Pain, unspecified: Secondary | ICD-10-CM

## 2013-08-20 NOTE — Telephone Encounter (Signed)
Patient's wife came by and dropped off letter to be faxed to [facility] for appeals process. She said [employer] told them to fax a letter-that there was no other form required to start appeals process. She said if they fax anything back to you, it will have to have a definite dx on it (fibromyalgia) or it will be denied again. Letter faxed as she requested.  He will need an extension on his work note, as it runs out on 08/23/13.  Patient's wife also asked if patient could be referred to either Dr. Gavin Potters or Dr. Kathi Ludwig. They both specialize in fibromyalgia and they would like additional input. They prefer Dr. Gavin Potters.   She also asked if he needed a new med or possibly a Veterinary surgeon. She said at his last visit they discussed him being aggressive, but she couldn't go into great detail with you. She said he is being very mean to her and their son. He is yelling, throwing things and almost looking dazed. He hasn't been physically abusive to them, but she is very concerned that things could escalate. She said she is almost ready to make him leave because he is being so mean. She doesn't know if a new med would help, if he needs a counselor or if he may need both. Of course, funds are limited without his income.

## 2013-08-21 ENCOUNTER — Encounter: Payer: Self-pay | Admitting: *Deleted

## 2013-08-21 MED ORDER — AMITRIPTYLINE HCL 50 MG PO TABS: 50.0000 mg | ORAL_TABLET | Freq: Every day | ORAL | Status: DC

## 2013-08-21 NOTE — Telephone Encounter (Signed)
Patient notified. Work note written and placed up front for pick up. Appt scheduled.

## 2013-08-21 NOTE — Telephone Encounter (Signed)
Ok to extend work note - until next scheduled appointment - and plz have him schedule appt for early December. May increase elavil to 50mg  nightly, may go up to 100mg  nightly after 2 wks.  I've sent 50mg  dose to pharmacy. I will place referral to Dr. Gavin Potters.

## 2013-08-23 ENCOUNTER — Ambulatory Visit: Payer: Self-pay | Admitting: Family Medicine

## 2013-09-06 ENCOUNTER — Telehealth: Payer: Self-pay | Admitting: *Deleted

## 2013-09-06 NOTE — Telephone Encounter (Signed)
Pt's wife called requesting amitriptyline rx be called in to [facility] [address], due to price (med is on the $4 generics plan). She also says that the dosage increase to 100 mg has made pt "spacey", and wants to know if it would be ok for him to split up the dose throughout the day. He has currently taking 4 of the 25 mg tabs at bedtime, to use up the 25 mg tabs and is almost out of med.  I called [facility] pharmacy and had them delete the 50 mg rx, since pt had not picked it up. He will need new rx sent to [facility].

## 2013-09-07 MED ORDER — AMITRIPTYLINE HCL 50 MG PO TABS: 50.0000 mg | ORAL_TABLET | Freq: Two times a day (BID) | ORAL | Status: DC

## 2013-09-07 NOTE — Telephone Encounter (Signed)
rx sent.  Okay to take 50mg  BID.  To PCP as FYI.

## 2013-09-08 NOTE — Telephone Encounter (Signed)
plz notify pt/wife ok to take amitriptyline 50mg  bid.

## 2013-09-09 ENCOUNTER — Other Ambulatory Visit: Payer: Self-pay | Admitting: Family Medicine

## 2013-09-10 ENCOUNTER — Ambulatory Visit (INDEPENDENT_AMBULATORY_CARE_PROVIDER_SITE_OTHER): Payer: BC Managed Care – PPO | Admitting: Family Medicine

## 2013-09-10 ENCOUNTER — Encounter: Payer: Self-pay | Admitting: *Deleted

## 2013-09-10 ENCOUNTER — Encounter: Payer: Self-pay | Admitting: Family Medicine

## 2013-09-10 VITALS — BP 134/92 | HR 102 | Temp 98.2°F | Ht 73.0 in | Wt 164.0 lb

## 2013-09-10 DIAGNOSIS — G43109 Migraine with aura, not intractable, without status migrainosus: Secondary | ICD-10-CM

## 2013-09-10 DIAGNOSIS — F418 Other specified anxiety disorders: Secondary | ICD-10-CM

## 2013-09-10 DIAGNOSIS — IMO0001 Reserved for inherently not codable concepts without codable children: Secondary | ICD-10-CM

## 2013-09-10 DIAGNOSIS — F341 Dysthymic disorder: Secondary | ICD-10-CM

## 2013-09-10 DIAGNOSIS — M797 Fibromyalgia: Secondary | ICD-10-CM

## 2013-09-10 NOTE — Assessment & Plan Note (Signed)
Seems to have entered quiescent period.  Will continue current meds as up to now. Letter releasing him to return to work provided today.  He is fully off hydrocodone as of this weekend.

## 2013-09-10 NOTE — Assessment & Plan Note (Signed)
Stable on amitriptyline 50mg  bid.  Continue this dose for now.

## 2013-09-10 NOTE — Progress Notes (Signed)
Subjective:    Patient ID: Adrian Walton, male    DOB: 19-Jul-1981, 32 y.o.   MRN: [id number]  HPI CC: f/u visit  See prior notes for details.  "Today is a good day".  Last bad day was several days ago - felt sore throughout body.  Had a bad sore day prior to thanksgiving - knees, arms, legs, lower back pain - stayed on couch.  Mental fog has lifted - endorsed improved focus.  Started walking more, doing lower back stretching exercises prior to walking. Walks daily around neighborhood.  Feels amitriptyline has helped some but not significant amount that he would like.  Still having intermittent diffuse body pains.  Still feels irritable and has had arguments with wife at home.   Off hydrocodone - last pill was 2 days ago.  Prior to this had been using sparingly.  Spoke with boss - expecting him today.  Looking forward to return to work. Work schedule - 3pm to night time, works 5 days a week with Fridays off.  Has appt with Dr. Gavin Potters on 09/23/2013 for rheumatology second opinion.  Vertiginous migraine diagnosed by neurology - no recent headaches in last few weeks.  No dizziness in last few weeks.  On topamax 50mg  daily.  Declines flu shot.   Medications and allergies reviewed and updated in chart.  Past histories reviewed and updated if relevant as below. Patient Active Problem List   Diagnosis Date Noted  . 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 Fibromyalgia 03/29/2013  . Anxiety associated with depression 01/29/2013   Past Medical History  Diagnosis Date  . Anxiety   . Depression     failed zoloft   History reviewed. No pertinent past surgical history. History  Substance Use Topics  . Smoking status: Current Some Day Smoker -- 0.50 packs/day for 10 years    Types: Cigarettes  . Smokeless tobacco: Never Used     Comment: Currently using e-cig  . Alcohol Use: Yes     Comment: occasional   Family History  Problem Relation Age of Onset  . Alcohol abuse Father   .  Cancer Mother     skin  . Diabetes Neg Hx   . CAD Neg Hx   . Stroke Neg Hx    No Known Allergies Current Outpatient Prescriptions on File Prior to Visit  Medication Sig Dispense Refill  . amitriptyline (ELAVIL) 50 MG tablet Take 1 tablet (50 mg total) by mouth 2 (two) times daily.  60 tablet  1  . hydrOXYzine (ATARAX/VISTARIL) 25 MG tablet Take 0.5-1 tablets (12.5-25 mg total) by mouth 3 (three) times daily as needed for anxiety.  40 tablet  0  . ibuprofen (ADVIL,MOTRIN) 600 MG tablet Take 600 mg by mouth every 8 (eight) hours as needed for pain.       No current facility-administered medications on file prior to visit.    Review of Systems Per HPI    Objective:   Physical Exam  Nursing note and vitals reviewed. Constitutional: He appears well-developed and well-nourished. No distress.  HENT:  Head: Normocephalic and atraumatic.  Mouth/Throat: Oropharynx is clear and moist. No oropharyngeal exudate.  Eyes: Conjunctivae and EOM are normal. Pupils are equal, round, and reactive to light. No scleral icterus.  Neck: Normal range of motion. Neck supple.  Musculoskeletal: He exhibits no edema.  Stays sore to palpation throughout paraspinal muscles of thoracic and lumbar spine and thigh muscles as well as diffuse soreness to palpation of joints (shoulders, elbows, wrists,  hips, knees).  No active synovitis  Lymphadenopathy:    He has no cervical adenopathy.       Assessment & Plan:

## 2013-09-10 NOTE — Patient Instructions (Signed)
Good to see you today. We will see what Dr. Gavin Potters thinks Continue meds as up to now.

## 2013-09-10 NOTE — Telephone Encounter (Signed)
Patient aware.

## 2013-09-10 NOTE — Progress Notes (Signed)
Pre-visit discussion using our clinic review tool. No additional management support is needed unless otherwise documented below in the visit note.  

## 2013-09-10 NOTE — Assessment & Plan Note (Signed)
Vertiginous migraine with brainstem aura - improved on topamax 50mg  daily.

## 2013-10-25 ENCOUNTER — Telehealth: Payer: Self-pay | Admitting: Family Medicine

## 2013-10-25 NOTE — Telephone Encounter (Signed)
Pt walked in and requested Dr. Sharen HonesGutierrez consider another medication for his fibromyalgia symptoms instead of Elavil, which makes him feel drowsy and drugged.  [facility] of choice [facility] [address] in [place].  Best  number to reach patient is [phone] / lt

## 2013-10-29 NOTE — Telephone Encounter (Signed)
Elavil even at 50mg  causing significant drowsiness next day. Taking this for mood disorder and possible fibromyalgia. Would he be willing to try duloxetine or cymbalta? If so would have him come pick up samples of med - start at 30mg  daily.  May decrease elavil to 1/2 tab daily for 1 wk then stop.

## 2013-10-30 MED ORDER — DULOXETINE HCL 30 MG PO CPEP: 30.0000 mg | ORAL_CAPSULE | Freq: Every day | ORAL | Status: DC

## 2013-10-30 NOTE — Telephone Encounter (Signed)
We don't have samples of 30mg -only 60mg . They only come in capsules, so unable to break. Do you want me to send it in? No coupons available in the office for a trial either. I haven't talked to the patient yet.

## 2013-10-30 NOTE — Telephone Encounter (Signed)
Patient notified and is willing to try the Cymbalta. Advised it had been sent in and gave him Elavil instructions. He also wanted me to ask you about the Wellbutrin he had asked you about for stopping smoking.

## 2013-10-30 NOTE — Telephone Encounter (Addendum)
plz notify cymbalta 30mg  has been sent in.  We have 60mg  available if we decide to increase after 3 wks on this dose. plz give him instructions on elavil as well.

## 2013-10-31 NOTE — Telephone Encounter (Signed)
I would try one medicine at a time for him.  Recommend cymbalta trial for several months prior to starting 2nd medication.

## 2013-10-31 NOTE — Telephone Encounter (Signed)
Patient notified

## 2014-06-12 ENCOUNTER — Other Ambulatory Visit: Payer: Self-pay | Admitting: Family Medicine

## 2014-07-04 ENCOUNTER — Other Ambulatory Visit: Payer: Self-pay | Admitting: Family Medicine

## 2014-11-07 ENCOUNTER — Ambulatory Visit (INDEPENDENT_AMBULATORY_CARE_PROVIDER_SITE_OTHER): Payer: BLUE CROSS/BLUE SHIELD | Admitting: Family Medicine

## 2014-11-07 ENCOUNTER — Other Ambulatory Visit: Payer: Self-pay | Admitting: *Deleted

## 2014-11-07 ENCOUNTER — Ambulatory Visit (INDEPENDENT_AMBULATORY_CARE_PROVIDER_SITE_OTHER)
Admission: RE | Admit: 2014-11-07 | Discharge: 2014-11-07 | Disposition: A | Discharge status: Home / Self Care | Payer: BLUE CROSS/BLUE SHIELD | Source: Ambulatory Visit | Attending: Family Medicine | Admitting: Family Medicine

## 2014-11-07 ENCOUNTER — Encounter: Payer: Self-pay | Admitting: Family Medicine

## 2014-11-07 VITALS — BP 118/72 | HR 110 | Temp 98.6°F | Wt 161.5 lb

## 2014-11-07 DIAGNOSIS — F17200 Nicotine dependence, unspecified, uncomplicated: Secondary | ICD-10-CM

## 2014-11-07 DIAGNOSIS — F418 Other specified anxiety disorders: Secondary | ICD-10-CM

## 2014-11-07 DIAGNOSIS — K219 Gastro-esophageal reflux disease without esophagitis: Secondary | ICD-10-CM | POA: Insufficient documentation

## 2014-11-07 DIAGNOSIS — Z720 Tobacco use: Secondary | ICD-10-CM

## 2014-11-07 DIAGNOSIS — R059 Cough, unspecified: Secondary | ICD-10-CM

## 2014-11-07 DIAGNOSIS — R05 Cough: Secondary | ICD-10-CM

## 2014-11-07 MED ORDER — OMEPRAZOLE 40 MG PO CPDR
40.0000 mg | DELAYED_RELEASE_CAPSULE | Freq: Every day | ORAL | Status: DC
Start: 2014-11-07 — End: 2015-04-30

## 2014-11-07 MED ORDER — BUPROPION HCL 75 MG PO TABS: 75.0000 mg | ORAL_TABLET | Freq: Two times a day (BID) | ORAL | Status: DC

## 2014-11-07 MED ORDER — DULOXETINE HCL 30 MG PO CPEP: 30.0000 mg | ORAL_CAPSULE | Freq: Every day | ORAL | Status: DC

## 2014-11-07 NOTE — Progress Notes (Signed)
Pre visit review using our clinic review tool, if applicable. No additional management support is needed unless otherwise documented below in the visit note. 

## 2014-11-07 NOTE — Patient Instructions (Addendum)
Check on cymbalta patient assistance (application provided).  Xray today. This may be related to untreated heartburn. Take omeprazole 40mg  daily for 3 weeks - take 30 min prior to breakfast or largest meal After three weeks of treatment update me with effect - and we will see if we need to continue omeprazole treatment.  Start wellbutrin 75mg  daily for 1 week then increase to twice daily

## 2014-11-07 NOTE — Progress Notes (Signed)
BP 118/72 mmHg  Pulse 110  Temp(Src) 98.6 F (37 C) (Oral)  Wt 161 lb 8 oz (73.256 kg)  SpO2 98%   CC: cough  Subjective:    Patient ID: Adrian Walton, male    DOB: 1981-08-13, 34 y.o.   MRN: [id number]  HPI: Adrian Walton is a 34 y.o. male presenting on 11/07/2014 for Cough; Joint Pain; and Insomnia   Not seen since 09/2013.  Presents with wife today with multiple concerns. Cold sxs early December. Ongoing cough since then. Dry cough. Worse at night time. Mild congestion and dyspnea. Gets winded easily.   Ongoing GERD sxs. Treats with tums, zantac which helped some. No allergy sxs - on PNDrainage, sinus congestion, sineezing, watery eyes.   No fevers/chills, ear or tooth pain, chest pain or tightness.  Persistent body aches. Unknown etiology. ?FM. Infrequent ibuprofen use.  Has tried mucinex DM - didn't help. Wife and son had similar cold which resolved quickly.   Mood - ongoing irritability for last 6-7 months, can't stand ppl at work. Also noticing trouble at home, snapping at son and wife. Failed sertraline (apathy), elavil caused drowsiness, cymbalta ineffective at 30mg . Citalopram may have exacerbated FM symptoms.  Smoking 3/4 ppd - has not tried med in the past. Was on wellbutrin in the past which caused nightmares but did decrease smoking while on med.  Relevant past medical, surgical, family and social history reviewed and updated as indicated. Interim medical history since our last visit reviewed. Allergies and medications reviewed and updated. Current Outpatient Prescriptions on File Prior to Visit  Medication Sig  . ibuprofen (ADVIL,MOTRIN) 600 MG tablet Take 600 mg by mouth every 8 (eight) hours as needed for pain.   No current facility-administered medications on file prior to visit.    Review of Systems Per HPI unless specifically indicated above     Objective:    BP 118/72 mmHg  Pulse 110  Temp(Src) 98.6 F (37 C) (Oral)  Wt 161 lb 8 oz (73.256  kg)  SpO2 98%  Wt Readings from Last 3 Encounters:  11/07/14 161 lb 8 oz (73.256 kg)  09/10/13 164 lb (74.39 kg)  08/13/13 164 lb 8 oz (74.617 kg)    Physical Exam  Constitutional: He appears well-developed and well-nourished. No distress.  HENT:  Head: Normocephalic and atraumatic.  Right Ear: Hearing, tympanic membrane, external ear and ear canal normal.  Left Ear: Hearing, tympanic membrane, external ear and ear canal normal.  Nose: Nose normal. No mucosal edema or rhinorrhea. Right sinus exhibits no maxillary sinus tenderness and no frontal sinus tenderness. Left sinus exhibits no maxillary sinus tenderness and no frontal sinus tenderness.  Mouth/Throat: Uvula is midline and mucous membranes are normal. Posterior oropharyngeal erythema present. No oropharyngeal exudate, posterior oropharyngeal edema or tonsillar abscesses.  Mucosal pallor Mild oropharyngeal cobblestoning  Eyes: Conjunctivae and EOM are normal. Pupils are equal, round, and reactive to light. No scleral icterus.  Neck: Normal range of motion. Neck supple.  Cardiovascular: Normal rate, regular rhythm, normal heart sounds and intact distal pulses.   No murmur heard. Pulmonary/Chest: Effort normal and breath sounds normal. No respiratory distress. He has no wheezes. He has no rales.  Dry cough present with expiration  Musculoskeletal: He exhibits no edema.  Lymphadenopathy:    He has no cervical adenopathy.  Skin: Skin is warm and dry. No rash noted.  Psychiatric: He exhibits a depressed mood.  Irritable  Nursing note and vitals reviewed.     Assessment &  Plan:   Problem List Items Addressed This Visit    Smoker    Discussed different options for smoking cessation. Pt opts to try for wellbutrin - start  daily for 1 wk then increase to  bid. Has tolerated in the past.      GERD (gastroesophageal reflux disease)    Endorses heartburn sxs - trial of omeprazole  daily.      Relevant Medications    omeprazole (PRILOSEC) capsule   Cough - Primary    17mo h/o dry cough that started after URI. Does endorse some GERD sxs - ?related. Check CXR given longevity of sxs. Recommended trial of omeprazole  daily for next 3 weeks then call me with update. Discussed not consistent with candidal infection despite wife's concerns.      Relevant Orders   DG Chest 2 View (Completed)   Anxiety associated with depression    Persistent trouble. Has failed multiple other meds including sertraline, celexa, amitriptyline.          Follow up plan: Return if symptoms worsen or fail to improve.

## 2014-11-08 DIAGNOSIS — F17200 Nicotine dependence, unspecified, uncomplicated: Secondary | ICD-10-CM | POA: Insufficient documentation

## 2014-11-08 NOTE — Assessment & Plan Note (Signed)
31mo h/o dry cough that started after URI. Does endorse some GERD sxs - ?related. Check CXR given longevity of sxs. Recommended trial of omeprazole 40mg  daily for next 3 weeks then call me with update. Discussed not consistent with candidal infection despite wife's concerns.

## 2014-11-08 NOTE — Assessment & Plan Note (Signed)
Persistent trouble. Has failed multiple other meds including sertraline, celexa, amitriptyline.

## 2014-11-08 NOTE — Assessment & Plan Note (Signed)
Endorses heartburn sxs - trial of omeprazole 40mg  daily.

## 2014-11-08 NOTE — Assessment & Plan Note (Signed)
Discussed different options for smoking cessation. Pt opts to try for wellbutrin - start 75mg  daily for 1 wk then increase to 75mg  bid. Has tolerated in the past.

## 2014-11-10 ENCOUNTER — Telehealth: Payer: Self-pay | Admitting: Family Medicine

## 2014-11-10 NOTE — Telephone Encounter (Signed)
emmi emailed °

## 2014-11-19 ENCOUNTER — Telehealth: Payer: Self-pay | Admitting: Family Medicine

## 2014-11-19 NOTE — Telephone Encounter (Signed)
Patient's wife called and asked if patient can be tested for Candida.  Patient's wife said the stool test is most accurate.  Patient's wife said patient has all the symptoms for Candida.  Patient's wife said he's not feeling better.

## 2014-11-21 NOTE — Telephone Encounter (Addendum)
I'm not familiar with stool test for candidiasis. Please have wife bring in information she is looking at specifically test she's talking about and symptoms that are consistent with candidiasis and I will review.

## 2014-11-21 NOTE — Telephone Encounter (Signed)
Message left notifying patient's wife to bring info by for review.

## 2015-02-01 NOTE — H&P (Signed)
PATIENT NAME:  Adrian Walton, DEARDORFF MR#:  [id number] DATE OF BIRTH:  January 22, 1981  DATE OF ADMISSION:  12/14/2011  PRIMARY CARE PHYSICIAN: None.   CHIEF COMPLAINT: Back pain, fever.  HISTORY OF PRESENT ILLNESS: Adrian Walton is a 34 year old Caucasian gentleman who was seen in the Emergency Room yesterday, was diagnosed to have urinary tract infection, given Levaquin and Pyridium. Patient says he has been having symptoms of back pain on and off for last three weeks. He was seen yesterday in the Emergency Room, discharged on antibiotics. Comes in today high-grade fever of 102.2, unbearable abdominal pain and back pain with some vomiting. He took Levaquin yesterday and today along with Pyridium. His urine is stained orange secondary to Pyridium. Given his symptoms of high-grade fever and inability to tolerate p.o. along with vomiting will admit patient for systemic inflammatory response syndrome secondary to severe cystitis/pyelonephritis. CT of the abdomen done yesterday did not show any stones, hydronephrosis. Showed thickening of the bladder wall.   PAST MEDICAL HISTORY: None.   ALLERGIES: No known drug allergies.   MEDICATIONS: None other than Levaquin which was started yesterday 500 mg p.o. daily and Pyridium 100 mg t.i.d.   FAMILY HISTORY: Positive for hypertension. No history of renal stones.   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 Smokes. Denies any alcohol use.   REVIEW OF SYSTEMS: CONSTITUTIONAL: Positive for fever, fatigue, weakness. EYES: No blurred or double vision. ENT: No tinnitus, ear pain, hearing loss. RESPIRATORY: No cough, wheeze, hemoptysis. CARDIOVASCULAR: No chest pain, orthopnea, edema. GASTROINTESTINAL: No nausea, vomiting, diarrhea, or abdominal pain. GENITOURINARY: No dysuria, hematuria. ENDOCRINE: No polyuria, nocturia. HEMATOLOGY: No anemia or easy bruising. SKIN: No acne, rash. MUSCULOSKELETAL: Positive for back pain. NEUROLOGIC: No  cerebrovascular accident, transient ischemic attack. PSYCH: No anxiety or depression. All other systems reviewed and negative.   PHYSICAL EXAMINATION:  GENERAL: Patient is awake, alert, oriented x3, mild to moderate distress due to back pain and vomiting.   VITAL SIGNS: Temperature 102.2, pulse 104, blood pressure 120/60.   HEENT: Atraumatic, normocephalic. Pupils are equal, round, and reactive to light and accommodation. Extraocular movements intact. Oral mucosa is moist.   NECK: Supple. No JVD. No carotid bruit.   RESPIRATORY: Clear to auscultation bilaterally. No rales, rhonchi, respiratory distress, or labored breathing.   CARDIOVASCULAR: Both the heart sounds are normal. Rate, rhythm is regular. PMI not lateralized. Chest nontender.   EXTREMITIES: Good pedal pulses, good femoral pulses. No lower extremity edema.   ABDOMEN: Soft, nontender. No organomegaly. Positive bowel sounds. There is some diffuse tenderness in the abdomen. No guarding or rigidity.   NEUROLOGICAL: Grossly intact cranial nerves II through XII. No motor or sensory deficits.   PSYCH: Patient is awake, alert, oriented x3.   SKIN: Warm and dry.   LABORATORY, DIAGNOSTIC AND RADIOLOGICAL DATA: Urinalysis positive for urinary tract infection. White count 15,000, hemoglobin and hematocrit 13.4 and 39.3, platelet count 189. Chemistry is within normal limits.   ASSESSMENT: 34 year old Adrian Walton with:  1. Systemic inflammatory response syndrome secondary to severe acute cystitis versus pyelonephritis, appears more due to acute cystitis.  2. Leukocytosis.  3. Vomiting and nausea.  4. Abdominal pain and back pain.   PLAN:  1. Admit patient to medical floor.  2. FULL CODE.  3. IV fluids for hydration.  4. Regular diet.  5. Will continue IV Levaquin 750 along with Pyridium t.i.d.  6. Follow up urine culture, blood cultures.  7. Morphine p.r.n. for pain. Zofran p.r.n.  for vomiting.  8. Further work-up according to  patient's clinical course. Above was discussed with patient and his wife on the phone.    TIME SPENT: 50 minutes.   ____________________________ Wylie HailSona A. Allena KatzPatel, MD sap:cms D: 12/14/2011 04:01:00 ET T: 12/14/2011 07:14:22 ET JOB#: [id number]  cc: Navin Dogan A. Allena KatzPatel, MD, <Dictator> Willow OraSONA A Aleeha Boline MD ELECTRONICALLY SIGNED 12/22/2011 16:04

## 2015-02-01 NOTE — Discharge Summary (Signed)
PATIENT NAME:  Adrian Walton, Adrian Walton MR#:  [id number] DATE OF BIRTH:  Oct 29, 1980  DATE OF ADMISSION:  12/14/2011 DATE OF DISCHARGE:  12/16/2011  DISCHARGE DIAGNOSES:  1. Systemic inflammatory response syndrome secondary to acute pyelonephritis.  2. Tobacco abuse.  3. Leukocytosis, resolved.  4. Flank pain, nausea, vomiting, resolved.   HOSPITAL COURSE:  34 year old male with no significant past medical history. He presented with fever and back pain. He was having back pain off and on for the last three weeks. He was seen in the Emergency Room initially on 12/12/2011. His urinalysis was pyuric with leukocyte esterase 2+ and 940 WBCs per high-power field. His white count was elevated at 19,000. He was discharged home on Levaquin and Pyridium.  He had a CT of the abdomen and pelvis done without contrast that shows no nephrolithiasis or hydronephrosis. The urinary bladder is not fully distended, may be slightly thickened.  The patient came back to ER with nausea, vomiting, and severe flank pain. He was unable to tolerate p.o. He was admitted as systemic inflammatory response syndrome secondary to cystitis versus pyelonephritis. He was having nausea, vomiting, abdominal pain, and flank pain. He was given aggressive IV hydration and IV Levaquin. Blood cultures were done. Blood cultures remained negative.  His urine cultures were done on Levaquin, which were negative. His repeat urinalysis had improved. His urinalysis showed nitrite positive and 16 WBCs which improved from 940 WBCs , on Levaquin. The patient's urinary tract infection is improving on Levaquin. His white count has normalized. His white count on March 7 was 7.6. His creatinine is stable at 1.11. His flank pain is significantly resolved. His nausea and vomiting have resolved.  He is tolerating  diet. He is complaining of a mild headache.  He does not want any prescription pain medications for home. I advised him to take Tylenol or Motrin as needed. He has  four days of Levaquin already. I am going to give him six more days to give him ten days of Levaquin. Levaquin 500 mg p.o. daily for a total of 10 days. Keep yourself hydrated. Regular diet. We will write a doctor's excuse for work. [place] up with [place] health [place]. [facility] [place].   CONDITION AT DISCHARGE: T-max 98.1, heart rate 69, blood pressure 131/83 to 103/64, saturating 97% on room air. Chest is clear. Heart sounds are regular. Abdomen is soft, nontender.     TIME SPENT ON DISCHARGE:  40 minutes.  ____________________________ Fredia SorrowAbhinav Kilo Eshelman, MD ag:bjt D: 12/16/2011 15:17:35 ET T: 12/17/2011 12:28:11 ET JOB#: [id number]  cc: Fredia SorrowAbhinav Madysun Thall, MD, <Dictator> [facility] [place] Tawan Corkern MD ELECTRONICALLY SIGNED 12/21/2011 16:21

## 2015-04-30 ENCOUNTER — Encounter: Payer: Self-pay | Admitting: Internal Medicine

## 2015-04-30 ENCOUNTER — Ambulatory Visit (INDEPENDENT_AMBULATORY_CARE_PROVIDER_SITE_OTHER): Payer: BLUE CROSS/BLUE SHIELD | Admitting: Internal Medicine

## 2015-04-30 VITALS — BP 126/80 | HR 75 | Temp 98.3°F | Wt 158.0 lb

## 2015-04-30 DIAGNOSIS — M797 Fibromyalgia: Secondary | ICD-10-CM

## 2015-04-30 MED ORDER — KETOROLAC TROMETHAMINE 30 MG/ML IJ SOLN
30.0000 mg | Freq: Once | INTRAMUSCULAR | Status: AC
  Administered 2015-04-30: 30 mg via INTRAMUSCULAR

## 2015-04-30 MED ORDER — DULOXETINE HCL 30 MG PO CPEP: 30.0000 mg | ORAL_CAPSULE | Freq: Every day | ORAL | Status: DC

## 2015-04-30 NOTE — Progress Notes (Signed)
Subjective:    Patient ID: Adrian Walton, male    DOB: 12-14-1980, 34 y.o.   MRN: [id number]  HPI  Pt presents to the clinic today with c/o generalized pain. His entire body hurts. This started back in 2014 but has gotten worse in the last 2 days. He describes it as "pins and needles."  He has been taking Ibuprofen 400-600 mg every 6 hours without any relief. He has had to call out of work secondary to the pain. He has a history of fibromyalgia. He was given RX for Cymbalta but reports he is unable to afford it. He was evaluated by Dr. Trudie Reed at [facility]. He only saw Dr. Trudie Reed once and never followed up. He is not sure what he should do. He has never been in this much pain before.  Review of Systems      Past Medical History  Diagnosis Date  . Anxiety   . Depression     failed zoloft    Current Outpatient Prescriptions  Medication Sig Dispense Refill  . ibuprofen (ADVIL,MOTRIN) 600 MG tablet Take 600 mg by mouth every 8 (eight) hours as needed for pain.     No current facility-administered medications for this visit.    No Known Allergies  Family History  Problem Relation Age of Onset  . Alcohol abuse Father   . Cancer Mother     skin  . Diabetes Neg Hx   . CAD Neg Hx   . Stroke Neg Hx     History   Social History  . Marital Status: Married    Spouse Name: N/A  . Number of Children: N/A  . Years of Education: N/A   Occupational History  . Not on file.   Social History Main Topics  . Smoking status: Current Some Day Smoker -- 0.75 packs/day for 10 years    Types: Cigarettes  . Smokeless tobacco: Never Used     Comment: Currently using e-cig  . Alcohol Use: 0.0 oz/week    0 Standard drinks or equivalent per week     Comment: occasional  . Drug Use: No  . Sexual Activity:    Partners: Female   Other Topics Concern  . Not on file   Social History Narrative   Caffeine: 2 energy drinks, 2 cups coffee, 2-3 sodas   Lives with wife, and son,  4 dogs, 2 cats   Occupation: Dealer   Edu: HS   Activity: stays active at work   Diet: good water, fruits/vegetables occasionally     Constitutional: Denies fever, malaise, fatigue, headache or abrupt weight changes. Respiratory: Denies difficulty breathing, shortness of breath, cough or sputum production.   Cardiovascular: Denies chest pain, chest tightness, palpitations or swelling in the hands or feet.  Musculoskeletal: Pt reports generalized pain. Denies decrease in range of motion, difficulty with gait, or joint swelling.  Skin: Denies redness, rashes, lesions or ulcercations.  Neurological: Denies dizziness, difficulty with memory, difficulty with speech or problems with balance and coordination.   No other specific complaints in a complete review of systems (except as listed in HPI above).  Objective:   Physical Exam   BP 126/80 mmHg  Pulse 75  Temp(Src) 98.3 F (36.8 C) (Oral)  Wt 158 lb (71.668 kg)  SpO2 98% Wt Readings from Last 3 Encounters:  04/30/15 158 lb (71.668 kg)  11/07/14 161 lb 8 oz (73.256 kg)  09/10/13 164 lb (74.39 kg)    General: Appears his stated  age, uncomfortable appearing in NAD. Skin: Warm, dry and intact. No rashes, lesions or ulcerations noted. Cardiovascular: Normal rate and rhythm. S1,S2 noted.  No murmur, rubs or gallops noted.  Pulmonary/Chest: Normal effort and positive vesicular breath sounds. No respiratory distress. No wheezes, rales or ronchi noted.  Musculoskeletal: He has pain with palpation of the bilateral shoulders, lumbar spine and paralumbar muscles. He does have normal ROM of the shoulders, spine and knees. Strength equal bilaterally upper and lower extremities but he reports it is painful with resistance Neurological: Alert and oriented. Sensation intact to upper and lower extremities.  BMET    Component Value Date/Time   NA 141 07/15/2013 1658   NA 141 12/15/2011 0320   K 4.4 07/15/2013 1658   K 3.9 12/15/2011  0320   CL 104 07/15/2013 1658   CL 105 12/15/2011 0320   CO2 29 07/15/2013 1658   CO2 26 12/15/2011 0320   GLUCOSE 83 07/15/2013 1658   GLUCOSE 93 12/15/2011 0320   BUN 16 07/15/2013 1658   BUN 8 12/15/2011 0320   CREATININE 1.0 07/15/2013 1658   CREATININE 1.11 12/15/2011 0320   CALCIUM 9.7 07/15/2013 1658   CALCIUM 8.3* 12/15/2011 0320   GFRNONAA >60 01/07/2008 0435   GFRAA  01/07/2008 0435    >60        The eGFR has been calculated using the MDRD equation. This calculation has not been validated in all clinical    Lipid Panel  No results found for: CHOL, TRIG, HDL, CHOLHDL, VLDL, LDLCALC  CBC    Component Value Date/Time   WBC 7.0 07/15/2013 1658   WBC 7.6 12/15/2011 0320   RBC 4.92 07/15/2013 1658   RBC 3.91* 12/15/2011 0320   HGB 16.0 07/15/2013 1658   HGB 12.2* 12/15/2011 0320   HCT 46.4 07/15/2013 1658   HCT 36.4* 12/15/2011 0320   PLT 232.0 07/15/2013 1658   PLT 193 12/15/2011 0320   MCV 94.4 07/15/2013 1658   MCV 93 12/15/2011 0320   MCH 31.3 12/15/2011 0320   MCHC 34.4 07/15/2013 1658   MCHC 33.6 12/15/2011 0320   RDW 12.3 07/15/2013 1658   RDW 12.1 12/15/2011 0320   LYMPHSABS 1.5 07/15/2013 1658   LYMPHSABS 1.2 12/15/2011 0320   MONOABS 0.4 07/15/2013 1658   MONOABS 0.9* 12/15/2011 0320   EOSABS 0.1 07/15/2013 1658   EOSABS 0.0 12/15/2011 0320   BASOSABS 0.0 07/15/2013 1658   BASOSABS 0.0 12/15/2011 0320    Hgb A1C No results found for: HGBA1C      Assessment & Plan:   Fibromyalgia:  eRx for Duloxetine 30 mg tab daily (Good RX coupon card given) Advised him to call me if Duloxetine too expensive and will call in alternative such as Meloxicam, Diclofenac, Robaxin or Neurontin 30 mg Toradol IM today A heating pad and stretching may be helpful Work note provided  Follow up with PCP if pain persist or worsen

## 2015-04-30 NOTE — Addendum Note (Signed)
Addended by: Roena Malady on: 04/30/2015 01:36 PM   Modules accepted: Orders

## 2015-04-30 NOTE — Progress Notes (Signed)
Pre visit review using our clinic review tool, if applicable. No additional management support is needed unless otherwise documented below in the visit note. 

## 2015-04-30 NOTE — Patient Instructions (Signed)
Fibromyalgia Fibromyalgia is a disorder that is often misunderstood. It is associated with muscular pains and tenderness that comes and goes. It is often associated with fatigue and sleep disturbances. Though it tends to be long-lasting, fibromyalgia is not life-threatening. CAUSES  The exact cause of fibromyalgia is unknown. People with certain gene types are predisposed to developing fibromyalgia and other conditions. Certain factors can play a role as triggers, such as:  Spine disorders.  Arthritis.  Severe injury (trauma) and other physical stressors.  Emotional stressors. SYMPTOMS   The main symptom is pain and stiffness in the muscles and joints, which can vary over time.  Sleep and fatigue problems. Other related symptoms may include:  Bowel and bladder problems.  Headaches.  Visual problems.  Problems with odors and noises.  Depression or mood changes.  Painful periods (dysmenorrhea).  Dryness of the skin or eyes. DIAGNOSIS  There are no specific tests for diagnosing fibromyalgia. Patients can be diagnosed accurately from the specific symptoms they have. The diagnosis is made by determining that nothing else is causing the problems. TREATMENT  There is no cure. Management includes medicines and an active, healthy lifestyle. The goal is to enhance physical fitness, decrease pain, and improve sleep. HOME CARE INSTRUCTIONS   Only take over-the-counter or prescription medicines as directed by your caregiver. Sleeping pills, tranquilizers, and pain medicines may make your problems worse.  Low-impact aerobic exercise is very important and advised for treatment. At first, it may seem to make pain worse. Gradually increasing your tolerance will overcome this feeling.  Learning relaxation techniques and how to control stress will help you. Biofeedback, visual imagery, hypnosis, muscle relaxation, yoga, and meditation are all options.  Anti-inflammatory medicines and  physical therapy may provide short-term help.  Acupuncture or massage treatments may help.  Take muscle relaxant medicines as suggested by your caregiver.  Avoid stressful situations.  Plan a healthy lifestyle. This includes your diet, sleep, rest, exercise, and friends.  Find and practice a hobby you enjoy.  Join a fibromyalgia support group for interaction, ideas, and sharing advice. This may be helpful. SEEK MEDICAL CARE IF:  You are not having good results or improvement from your treatment. FOR MORE INFORMATION  National Fibromyalgia Association: www.fmaware.org Arthritis Foundation: www.arthritis.org Document Released: 09/26/2005 Document Revised: 12/19/2011 Document Reviewed: 01/06/2010 ExitCare Patient Information 2015 [employer]. This information is not intended to replace advice given to you by your health care provider. Make sure you discuss any questions you have with your health care provider.  

## 2015-08-30 ENCOUNTER — Other Ambulatory Visit: Payer: Self-pay | Admitting: Internal Medicine

## 2015-10-22 ENCOUNTER — Other Ambulatory Visit: Payer: Self-pay | Admitting: Internal Medicine

## 2015-10-22 NOTE — Telephone Encounter (Signed)
Last filled 09/01/2015 with no refills--pt was last seen 10/2014 by you please advise

## 2015-10-31 ENCOUNTER — Emergency Department: Payer: BLUE CROSS/BLUE SHIELD

## 2015-10-31 ENCOUNTER — Emergency Department
Admission: EM | Admit: 2015-10-31 | Discharge: 2015-10-31 | Disposition: A | Discharge status: Home / Self Care | Payer: BLUE CROSS/BLUE SHIELD | Attending: Student | Admitting: Student

## 2015-10-31 ENCOUNTER — Encounter: Payer: Self-pay | Admitting: Emergency Medicine

## 2015-10-31 DIAGNOSIS — F17210 Nicotine dependence, cigarettes, uncomplicated: Secondary | ICD-10-CM | POA: Insufficient documentation

## 2015-10-31 DIAGNOSIS — M7551 Bursitis of right shoulder: Secondary | ICD-10-CM

## 2015-10-31 DIAGNOSIS — Z79899 Other long term (current) drug therapy: Secondary | ICD-10-CM | POA: Insufficient documentation

## 2015-10-31 DIAGNOSIS — M25511 Pain in right shoulder: Secondary | ICD-10-CM | POA: Diagnosis present

## 2015-10-31 MED ORDER — ETODOLAC 400 MG PO TABS: 400.0000 mg | ORAL_TABLET | Freq: Two times a day (BID) | ORAL | Status: DC

## 2015-10-31 MED ORDER — KETOROLAC TROMETHAMINE 60 MG/2ML IM SOLN
60.0000 mg | Freq: Once | INTRAMUSCULAR | Status: AC
  Administered 2015-10-31: 60 mg via INTRAMUSCULAR
  Filled 2015-10-31: qty 2

## 2015-10-31 NOTE — ED Provider Notes (Signed)
[facility] Emergency Department Provider Note  ____________________________________________  Time seen: Approximately 2:00 PM  I have reviewed the triage vital signs and the nursing notes.   HISTORY  Chief Complaint Shoulder Pain   HPI Adrian Walton is a 35 y.o. male is here with complaint of right shoulder pain 2 weeks. Patient denies any injury but states that gradually the pain has gotten worse until now is severe. Pain is increased with range of motion but rest does not improve his pain. He has been taking Tylenol over-the-counter without any relief. Patient has history of fibromyalgia but currently is working 12-14 hours a day every day for 5 days. Currently he rates his pain as 10 out of 10.   Past Medical History  Diagnosis Date  . Anxiety   . Depression     failed zoloft    Patient Active Problem List   Diagnosis Date Noted  . Smoker 11/08/2014  . Cough 11/07/2014  . GERD (gastroesophageal reflux disease) 11/07/2014  . 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 Fibromyalgia 03/29/2013  . Anxiety associated with depression 01/29/2013    History reviewed. No pertinent past surgical history.  Current Outpatient Rx  Name  Route  Sig  Dispense  Refill  . DULoxetine (CYMBALTA) 30 MG capsule      TAKE 1 CAPSULE (30 MG TOTAL) BY MOUTH DAILY.   30 capsule   3     plz schedule f/u for more refills   . etodolac (LODINE) 400 MG tablet   Oral   Take 1 tablet (400 mg total) by mouth 2 (two) times daily.   20 tablet   0   . ibuprofen (ADVIL,MOTRIN) 600 MG tablet   Oral   Take 600 mg by mouth every 8 (eight) hours as needed for pain.           Allergies Review of patient's allergies indicates no known allergies.  Family History  Problem Relation Age of Onset  . Alcohol abuse Father   . Cancer Mother     skin  . Diabetes Neg Hx   . CAD Neg Hx   . Stroke Neg Hx     Social History Social History  Substance Use Topics  . Smoking  status: Current Some Day Smoker -- 0.50 packs/day for 10 years    Types: Cigarettes  . Smokeless tobacco: Never Used     Comment: Currently using e-cig  . Alcohol Use: 0.0 oz/week    0 Standard drinks or equivalent per week     Comment: occasional    Review of Systems Constitutional: No fever/chills Cardiovascular: Denies chest pain. Respiratory: Denies shortness of breath. Gastrointestinal:   No nausea, no vomiting.   Musculoskeletal: Negative for back pain. Positive right shoulder pain Skin: Negative for rash. Neurological: Negative for headaches, focal weakness or numbness.  10-point ROS otherwise negative.  ____________________________________________   PHYSICAL EXAM:  VITAL SIGNS: ED Triage Vitals  Enc Vitals Group     BP 10/31/15 1318 135/86 mmHg     Pulse Rate 10/31/15 1318 87     Resp 10/31/15 1318 18     Temp 10/31/15 1318 98.1 F (36.7 C)     Temp Source 10/31/15 1318 Oral     SpO2 10/31/15 1318 100 %     Weight 10/31/15 1318 160 lb (72.576 kg)     Height 10/31/15 1318  (1.854 m)     Head Cir --      Peak Flow --  Pain Score 10/31/15 1319 10     Pain Loc --      Pain Edu? --      Excl. in GC? --     Constitutional: Alert and oriented. Well appearing and in no acute distress. Eyes: Conjunctivae are normal. PERRL. EOMI. Head: Atraumatic. Nose: No congestion/rhinnorhea. Neck: No stridor.  No cervical tenderness on palpation posteriorly. Cardiovascular: Normal rate, regular rhythm. Grossly normal heart sounds.  Good peripheral circulation. Respiratory: Normal respiratory effort.  No retractions. Lungs CTAB. Gastrointestinal: Soft and nontender. No distention.  Musculoskeletal: No gross deformities noted of the right shoulder. No soft tissue edema is present however there is marked tenderness on palpation of the deltoid insertion and anterior right shoulder. Range of motion is markedly restricted secondary to patient's pain. No crepitus was noted. No  warmth or redness is noted. Neurologic:  Normal speech and language. No gross focal neurologic deficits are appreciated. No gait instability. Skin:  Skin is warm, dry and intact. No rash noted. No ecchymosis, erythema or abrasions were noted. Psychiatric: Mood and affect are normal. Speech and behavior are normal.  ____________________________________________   LABS (all labs ordered are listed, but only abnormal results are displayed)  Labs Reviewed - No data to display RADIOLOGY  Right shoulder per radiologist no acute fracture dislocation present. ____________________________________________   PROCEDURES  Procedure(s) performed: None  Critical Care performed: No  ____________________________________________   INITIAL IMPRESSION / ASSESSMENT AND PLAN / ED COURSE  Pertinent labs & imaging results that were available during my care of the patient were reviewed by me and considered in my medical decision making (see chart for details).  He  was given Toradol 60 g IM while in the emergency room. His also given a prescription for etodolac 400 mg twice a day. Start using ice or heat to his shoulder and also follow-up with his PCP or Dr. Joice Lofts if any continued problems. ____________________________________________   FINAL CLINICAL IMPRESSION(S) / ED DIAGNOSES  Final diagnoses:  Acute shoulder bursitis, right      Tommi Rumps, PA-C 10/31/15 1559  Gayla Doss, MD 11/01/15 [phone]

## 2015-10-31 NOTE — Discharge Instructions (Signed)
Bursitis Bursitis is when the fluid-filled sac (bursa) that covers and protects a joint is swollen (inflamed). Bursitis is most common near joints, especially the knees, elbows, hips, and shoulders.  HOME CARE  Take medicines only as told by your doctor.  If you were prescribed an antibiotic medicine, finish it all even if you start to feel better.  Rest the affected area as told by your doctor.  Keep the area raised up.  Avoid doing things that make the pain worse.  Apply ice to the injured area:  Place ice in a plastic bag.  Place a towel between your skin and the bag.  Leave the ice on for 20 minutes, 2-3 times a day.  Use splints, braces, pads, or walking aids as told by your doctor.  Keep all follow-up visits as told by your doctor. This is important. GET HELP IF:   You have more pain with home care.  You have a fever.  You have chills.   This information is not intended to replace advice given to you by your health care provider. Make sure you discuss any questions you have with your health care provider.   Document Released: 03/16/2010 Document Revised: 10/17/2014 Document Reviewed: 12/16/2013 Elsevier Interactive Patient Education 2016 [employer].   Follow-up with your primary care doctor or Dr. Joice Lofts if any continued problems. Use ice or heat to your shoulder to see which gives you more relief. Begin taking etodolac twice a day with food.

## 2015-10-31 NOTE — ED Notes (Signed)
Discussed discharge instructions, prescriptions, and follow-up care with patient. No questions or concerns at this time. Pt stable at discharge.  

## 2015-10-31 NOTE — ED Notes (Signed)
R shoulder pain with movement x 2 weeks, denies injury at onset, severe pain with movement.

## 2015-11-20 ENCOUNTER — Encounter: Payer: Self-pay | Admitting: Family Medicine

## 2015-11-20 ENCOUNTER — Ambulatory Visit (INDEPENDENT_AMBULATORY_CARE_PROVIDER_SITE_OTHER): Payer: BLUE CROSS/BLUE SHIELD | Admitting: Family Medicine

## 2015-11-20 VITALS — BP 110/70 | HR 79 | Temp 97.8°F | Wt 163.0 lb

## 2015-11-20 DIAGNOSIS — M797 Fibromyalgia: Secondary | ICD-10-CM | POA: Diagnosis not present

## 2015-11-20 DIAGNOSIS — M25511 Pain in right shoulder: Secondary | ICD-10-CM | POA: Diagnosis not present

## 2015-11-20 DIAGNOSIS — M25512 Pain in left shoulder: Secondary | ICD-10-CM | POA: Diagnosis not present

## 2015-11-20 MED ORDER — DULOXETINE HCL 60 MG PO CPEP: 60.0000 mg | ORAL_CAPSULE | Freq: Every day | ORAL | Status: DC

## 2015-11-20 MED ORDER — PREDNISONE 20 MG PO TABS: ORAL_TABLET | ORAL | Status: DC

## 2015-11-20 NOTE — Assessment & Plan Note (Signed)
cymbalta initially effective but may have now lost effect. Requests higher dose -  sent to pharmacy.

## 2015-11-20 NOTE — Assessment & Plan Note (Signed)
R>L. Anticipate combination of subacromial bursitis and biceps tendonitis. Did not respond to NSAID course. Discussed treatment options including prednisone course, steroid injection, referral to [facility] for possible [place] and further eval/treatment. Pt opts to start with prednisone course - discussed side effects to monitor. Update with effect

## 2015-11-20 NOTE — Progress Notes (Signed)
Pre visit review using our clinic review tool, if applicable. No additional management support is needed unless otherwise documented below in the visit note. 

## 2015-11-20 NOTE — Progress Notes (Signed)
   BP 110/70 mmHg  Pulse 79  Temp(Src) 97.8 F (36.6 C) (Tympanic)  Wt 163 lb (73.936 kg)  SpO2 98%   CC: discuss shoulder pain  Subjective:    Patient ID: Adrian Walton, male    DOB: 1981-08-04, 35 y.o.   MRN: [id number]  HPI: Adrian Walton is a 35 y.o. male presenting on 11/20/2015 for Shoulder Pain   Presents with 1 mo h/o R anterior shoulder pain that progressively has worsened. Now L shoulder has started aching. Denies neck pain, shooting pain down arms, numbness or weakness down arms.   Seen at ER 10/31/2015 with 2 wk h/o R shoulder pain without known inciting trauma. Dx with acute shoulder bursitis and treated with etodolac  bid and toradol IM . This did not help.  Fibromyalgia - has seen Dr Nickola Major at [facility]. Could not afford f/u. Could not afford cymbalta. Normally takes ibuprofen 400-600mg  Q6 hrs prn.   Currently taking  cymbalta and feels this is working well.   Relevant past medical, surgical, family and social history reviewed and updated as indicated. Interim medical history since our last visit reviewed. Allergies and medications reviewed and updated. No current outpatient prescriptions on file prior to visit.   No current facility-administered medications on file prior to visit.    Review of Systems Per HPI unless specifically indicated in ROS section     Objective:    BP 110/70 mmHg  Pulse 79  Temp(Src) 97.8 F (36.6 C) (Tympanic)  Wt 163 lb (73.936 kg)  SpO2 98%  Wt Readings from Last 3 Encounters:  11/20/15 163 lb (73.936 kg)  10/31/15 160 lb (72.576 kg)  04/30/15 158 lb (71.668 kg)    Physical Exam  Constitutional: He appears well-developed and well-nourished. No distress.  Musculoskeletal: He exhibits no edema.  Bilateral Shoulder exam: No deformity of shoulders on inspection. + pain with palpation of AC joint and anterior shoulder joint FROM in abduction and forward flexion but some pain present. No pain or weakness with  testing SITS in ext/int rotation. No significant pain with empty can sign. + Yerguson, negative Speed test. No impingement. + pain with crossover test. No pain with rotation of humeral head in GH joint.   Skin: Skin is warm and dry. No rash noted.  Nursing note and vitals reviewed.  RIGHT SHOULDER - 2+ VIEW COMPARISON: None FINDINGS: Four views of the right shoulder submitted. No acute fracture or subluxation. AC joint and glenohumeral joint are preserved. IMPRESSION: Negative. Electronically Signed  By: Natasha Mead M.D.  On: 10/31/2015 14:18    Assessment & Plan:   Problem List Items Addressed This Visit    Fibromyalgia    cymbalta initially effective but may have now lost effect. Requests higher dose -  sent to pharmacy.      Bilateral shoulder pain - Primary    R>L. Anticipate combination of subacromial bursitis and biceps tendonitis. Did not respond to NSAID course. Discussed treatment options including prednisone course, steroid injection, referral to [facility] for possible [place] and further eval/treatment. Pt opts to start with prednisone course - discussed side effects to monitor. Update with effect          Follow up plan: Return if symptoms worsen or fail to improve.

## 2015-11-20 NOTE — Patient Instructions (Signed)
Start cymbalta  daily. I think you have combination of tendonitis of biceps and bursitis of shoulder Treat with prednisone course for next 1 week.  Let us know if not improved, would consider steroid injection into shoulder or referral to sports medicine.

## 2015-11-20 NOTE — Addendum Note (Signed)
Addended by: Eustaquio Boyden on: 11/20/2015 04:29 PM   Modules accepted: Level of Service, SmartSet

## 2015-12-18 ENCOUNTER — Emergency Department (HOSPITAL_COMMUNITY): Payer: No Typology Code available for payment source

## 2015-12-18 ENCOUNTER — Encounter (HOSPITAL_COMMUNITY): Payer: Self-pay | Admitting: Vascular Surgery

## 2015-12-18 DIAGNOSIS — F17210 Nicotine dependence, cigarettes, uncomplicated: Secondary | ICD-10-CM | POA: Insufficient documentation

## 2015-12-18 DIAGNOSIS — S8002XA Contusion of left knee, initial encounter: Secondary | ICD-10-CM | POA: Diagnosis not present

## 2015-12-18 DIAGNOSIS — Z79899 Other long term (current) drug therapy: Secondary | ICD-10-CM | POA: Diagnosis not present

## 2015-12-18 DIAGNOSIS — S8991XA Unspecified injury of right lower leg, initial encounter: Secondary | ICD-10-CM | POA: Diagnosis present

## 2015-12-18 DIAGNOSIS — S199XXA Unspecified injury of neck, initial encounter: Secondary | ICD-10-CM | POA: Diagnosis not present

## 2015-12-18 DIAGNOSIS — F419 Anxiety disorder, unspecified: Secondary | ICD-10-CM | POA: Insufficient documentation

## 2015-12-18 DIAGNOSIS — Y92410 Unspecified street and highway as the place of occurrence of the external cause: Secondary | ICD-10-CM | POA: Diagnosis not present

## 2015-12-18 DIAGNOSIS — Y9389 Activity, other specified: Secondary | ICD-10-CM | POA: Insufficient documentation

## 2015-12-18 DIAGNOSIS — F329 Major depressive disorder, single episode, unspecified: Secondary | ICD-10-CM | POA: Diagnosis not present

## 2015-12-18 DIAGNOSIS — Y998 Other external cause status: Secondary | ICD-10-CM | POA: Insufficient documentation

## 2015-12-18 DIAGNOSIS — S8001XA Contusion of right knee, initial encounter: Secondary | ICD-10-CM | POA: Diagnosis not present
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
## 2015-12-19 ENCOUNTER — Emergency Department (HOSPITAL_COMMUNITY)
Admission: EM | Admit: 2015-12-19 | Discharge: 2015-12-19 | Disposition: A | Discharge status: Home / Self Care | Payer: No Typology Code available for payment source | Attending: Emergency Medicine | Admitting: Emergency Medicine

## 2015-12-19 ENCOUNTER — Emergency Department (HOSPITAL_COMMUNITY): Payer: No Typology Code available for payment source

## 2015-12-19 DIAGNOSIS — S8002XA Contusion of left knee, initial encounter: Secondary | ICD-10-CM

## 2015-12-19 DIAGNOSIS — M542 Cervicalgia: Secondary | ICD-10-CM

## 2015-12-19 DIAGNOSIS — S8001XA Contusion of right knee, initial encounter: Secondary | ICD-10-CM

## 2015-12-19 MED ORDER — IBUPROFEN 600 MG PO TABS: 600.0000 mg | ORAL_TABLET | Freq: Three times a day (TID) | ORAL | Status: AC | PRN

## 2015-12-19 MED ORDER — METHOCARBAMOL 500 MG PO TABS: 500.0000 mg | ORAL_TABLET | Freq: Four times a day (QID) | ORAL | Status: DC | PRN

## 2015-12-19 MED ORDER — OXYCODONE-ACETAMINOPHEN 5-325 MG PO TABS
ORAL_TABLET | ORAL | Status: AC
  Filled 2015-12-19: qty 1

## 2015-12-19 MED ORDER — OXYCODONE-ACETAMINOPHEN 5-325 MG PO TABS
1.0000 | ORAL_TABLET | Freq: Once | ORAL | Status: AC
  Administered 2015-12-19: 1 via ORAL

## 2015-12-19 MED ORDER — METHOCARBAMOL 500 MG PO TABS: 500.0000 mg | ORAL_TABLET | Freq: Once | ORAL | Status: DC

## 2015-12-19 MED ORDER — METHOCARBAMOL 500 MG PO TABS
500.0000 mg | ORAL_TABLET | Freq: Once | ORAL | Status: AC
  Administered 2015-12-19: 500 mg via ORAL
  Filled 2015-12-19: qty 1
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
## 2015-12-21 ENCOUNTER — Ambulatory Visit: Payer: Self-pay | Admitting: Primary Care

## 2015-12-21 ENCOUNTER — Ambulatory Visit (INDEPENDENT_AMBULATORY_CARE_PROVIDER_SITE_OTHER): Payer: BLUE CROSS/BLUE SHIELD | Admitting: Family Medicine

## 2015-12-21 ENCOUNTER — Encounter: Payer: Self-pay | Admitting: Family Medicine

## 2015-12-21 ENCOUNTER — Ambulatory Visit: Payer: Self-pay | Admitting: Family Medicine

## 2015-12-21 VITALS — BP 130/72 | HR 82 | Temp 98.5°F | Wt 163.0 lb

## 2015-12-21 DIAGNOSIS — M25561 Pain in right knee: Secondary | ICD-10-CM | POA: Diagnosis not present

## 2015-12-21 DIAGNOSIS — M25569 Pain in unspecified knee: Secondary | ICD-10-CM | POA: Insufficient documentation

## 2015-12-21 MED ORDER — HYDROCODONE-ACETAMINOPHEN 5-325 MG PO TABS: 1.0000 | ORAL_TABLET | Freq: Four times a day (QID) | ORAL | Status: DC | PRN
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
## 2015-12-21 NOTE — Patient Instructions (Addendum)
Stop the methocarbamol and start hydrocodone.  Sedation caution.  Continue ibuprofen with food.  Ice your knee.  Use crutches in the meantime.  Update Dr. Reece AgarG in a few days.  Out of work for now.  Take care.  Glad to see you.

## 2015-12-22 NOTE — Assessment & Plan Note (Signed)
D/w pt.  Short course of hydrocodone with sedation caution, stop muscle relaxer, continue ibuprofen with GI caution.  Use crutches for now.  No need to immobilize the knee as long as not weight bearing.  Out of work for now.  Imaging reviewed.  Okay for outpatient f/u. We can reexamine his knee if needed if pain continues.  Further imaging at this point wouldn't change mgmt.  D/w pt.  >25 minutes spent in face to face time with patient, >50% spent in counselling or coordination of care.  Routed to PCP as FYI.

## 2015-12-28 ENCOUNTER — Encounter: Payer: Self-pay | Admitting: Family Medicine

## 2015-12-28 ENCOUNTER — Ambulatory Visit (INDEPENDENT_AMBULATORY_CARE_PROVIDER_SITE_OTHER): Payer: BLUE CROSS/BLUE SHIELD | Admitting: Family Medicine

## 2015-12-28 VITALS — BP 124/76 | HR 84 | Temp 98.3°F | Wt 163.0 lb

## 2015-12-28 DIAGNOSIS — M25561 Pain in right knee: Secondary | ICD-10-CM

## 2015-12-28 MED ORDER — HYDROCODONE-ACETAMINOPHEN 5-325 MG PO TABS: 1.0000 | ORAL_TABLET | Freq: Four times a day (QID) | ORAL | Status: AC | PRN

## 2015-12-28 NOTE — Assessment & Plan Note (Signed)
Pain limits exam today.  No sign of emergent issue that would require hospitalization.  Short term rx done for pain meds.  Refer to ortho.  Continue crutches, icing.  It is likely that his baseline pain issues, ie dx of fibromyalgia, have exacerbated his response to any possible pain source in his knee.  He acknowledges this and agrees.  I don't know what all can be done at this point re: his pain level, thus the referral to ortho.   Routed to PCP as FYI.

## 2015-12-28 NOTE — Progress Notes (Signed)
Pre visit review using our clinic review tool, if applicable. No additional management support is needed unless otherwise documented below in the visit note.  F/u from [facility] last week.  Still with pain.  Taking vicodin prn in the meantime, can get a little more comfortable with the med, when used.  Still with R knee pain.  Prev imaging neg.  No FCNAVD.  He can put some weight on his R leg, better with crutches.  Pain with ROM.    Prev xray R knee with : There is no evidence of fracture, dislocation, or joint effusion. There is no evidence of arthropathy or other focal bone abnormality. Soft tissues are unremarkable.  Meds, vitals, and allergies reviewed.   ROS: See HPI.  Otherwise, noncontributory.  nad but uncomfortable.  L knee with normal inspection.  No bruising, no swelling, no redness.  L knee not ttp on posterior side but ttp along medial and lateral joint line and along the patella.  He winces with only bruising the skin lightly.  There is no way to fully examine his knee comfortably give his stated level of pain.

## 2015-12-28 NOTE — Patient Instructions (Signed)
In the meantime- crutches, icing and vicodin with sedation caution.  Adrian Walton will call about your referral to ortho.  See her on the way out.  Take care.  I'll notify Dr. Reece AgarG in the meantime.

## 2015-12-30 ENCOUNTER — Telehealth: Payer: Self-pay | Admitting: Family Medicine

## 2015-12-30 NOTE — Telephone Encounter (Signed)
Short term disability  In dr Para Marchduncan in box For review and signature

## 2015-12-30 NOTE — Telephone Encounter (Signed)
I'll work on the hard copy when possible.  Thanks.  

## 2016-01-04 NOTE — Telephone Encounter (Signed)
Should be good to go.  Thanks.  

## 2016-01-04 NOTE — Telephone Encounter (Signed)
Pt called checking on paperwork Pt understand your were out of the office 3/23-3/24 He needs paperwork asap so he can get paid

## 2016-01-04 NOTE — Telephone Encounter (Signed)
Paperwork faxed 01/04/16 Pt aware  Copy for pt Copy for scan Copy for file

## 2016-01-04 NOTE — Telephone Encounter (Signed)
I think this paperwork came to you.  Is this correct?
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
## 2016-09-20 ENCOUNTER — Other Ambulatory Visit: Payer: Self-pay | Admitting: Family Medicine

## 2017-05-22 ENCOUNTER — Other Ambulatory Visit: Payer: Self-pay | Admitting: Family Medicine
